# Patient Record
Sex: Male | Born: 1940 | Race: White | Hispanic: No | Marital: Married
Health system: Southern US, Community
[De-identification: ages and names within clinical notes are randomized; demographics above are authoritative.]

## PROBLEM LIST (undated history)

## (undated) DIAGNOSIS — Z8679 Personal history of other diseases of the circulatory system: Secondary | ICD-10-CM

## (undated) DIAGNOSIS — I10 Essential (primary) hypertension: Secondary | ICD-10-CM

## (undated) DIAGNOSIS — K631 Perforation of intestine (nontraumatic): Secondary | ICD-10-CM

## (undated) DIAGNOSIS — G300 Alzheimer's disease with early onset: Secondary | ICD-10-CM

## (undated) DIAGNOSIS — H47011 Ischemic optic neuropathy, right eye: Secondary | ICD-10-CM

## (undated) DIAGNOSIS — E559 Vitamin D deficiency, unspecified: Secondary | ICD-10-CM

## (undated) DIAGNOSIS — E785 Hyperlipidemia, unspecified: Secondary | ICD-10-CM

## (undated) DIAGNOSIS — F0280 Dementia in other diseases classified elsewhere without behavioral disturbance: Secondary | ICD-10-CM

## (undated) HISTORY — PX: TRABECULECTOMY: SHX107

## (undated) HISTORY — PX: CARDIOVERSION: SHX1299

---

## 1989-07-11 DIAGNOSIS — K631 Perforation of intestine (nontraumatic): Secondary | ICD-10-CM

## 1989-07-11 HISTORY — PX: COLON RESECTION: SHX5231

## 1989-07-11 HISTORY — DX: Perforation of intestine (nontraumatic): K63.1

## 1990-07-11 HISTORY — PX: COLOSTOMY REVERSAL: SHX5782

## 2016-05-17 ENCOUNTER — Inpatient Hospital Stay
Admission: RE | Admit: 2016-05-17 | Discharge: 2016-06-24 | Disposition: A | Discharge status: Rehabilitation Facility | Payer: BLUE CROSS/BLUE SHIELD | Attending: Internal Medicine | Admitting: Internal Medicine

## 2016-05-17 ENCOUNTER — Other Ambulatory Visit (HOSPITAL_COMMUNITY): Payer: BLUE CROSS/BLUE SHIELD

## 2016-05-17 DIAGNOSIS — J939 Pneumothorax, unspecified: Secondary | ICD-10-CM

## 2016-05-17 DIAGNOSIS — Z9689 Presence of other specified functional implants: Secondary | ICD-10-CM

## 2016-05-17 DIAGNOSIS — Z4659 Encounter for fitting and adjustment of other gastrointestinal appliance and device: Secondary | ICD-10-CM

## 2016-05-17 DIAGNOSIS — Z931 Gastrostomy status: Secondary | ICD-10-CM

## 2016-05-17 DIAGNOSIS — J90 Pleural effusion, not elsewhere classified: Secondary | ICD-10-CM

## 2016-05-17 DIAGNOSIS — J9690 Respiratory failure, unspecified, unspecified whether with hypoxia or hypercapnia: Secondary | ICD-10-CM

## 2016-05-17 DIAGNOSIS — R0602 Shortness of breath: Secondary | ICD-10-CM

## 2016-05-17 DIAGNOSIS — Z4682 Encounter for fitting and adjustment of non-vascular catheter: Secondary | ICD-10-CM

## 2016-05-17 DIAGNOSIS — J9600 Acute respiratory failure, unspecified whether with hypoxia or hypercapnia: Secondary | ICD-10-CM

## 2016-05-17 HISTORY — DX: Vitamin D deficiency, unspecified: E55.9

## 2016-05-17 HISTORY — DX: Alzheimer's disease with early onset: G30.0

## 2016-05-17 HISTORY — DX: Personal history of other diseases of the circulatory system: Z86.79

## 2016-05-17 HISTORY — DX: Essential (primary) hypertension: I10

## 2016-05-17 HISTORY — DX: Ischemic optic neuropathy, right eye: H47.011

## 2016-05-17 HISTORY — DX: Hyperlipidemia, unspecified: E78.5

## 2016-05-17 HISTORY — DX: Dementia in other diseases classified elsewhere, unspecified severity, without behavioral disturbance, psychotic disturbance, mood disturbance, and anxiety: F02.80

## 2016-05-17 HISTORY — DX: Perforation of intestine (nontraumatic): K63.1

## 2016-05-18 ENCOUNTER — Other Ambulatory Visit (HOSPITAL_COMMUNITY): Payer: BLUE CROSS/BLUE SHIELD

## 2016-05-18 DIAGNOSIS — Z4682 Encounter for fitting and adjustment of non-vascular catheter: Secondary | ICD-10-CM

## 2016-05-18 DIAGNOSIS — Z4659 Encounter for fitting and adjustment of other gastrointestinal appliance and device: Secondary | ICD-10-CM | POA: Diagnosis not present

## 2016-05-18 DIAGNOSIS — J9600 Acute respiratory failure, unspecified whether with hypoxia or hypercapnia: Secondary | ICD-10-CM

## 2016-05-18 LAB — COMPREHENSIVE METABOLIC PANEL
ALBUMIN: 1.6 g/dL — AB (ref 3.5–5.0)
ALT: 36 U/L (ref 17–63)
AST: 45 U/L — AB (ref 15–41)
Alkaline Phosphatase: 115 U/L (ref 38–126)
Anion gap: 8 (ref 5–15)
BUN: 21 mg/dL — AB (ref 6–20)
CHLORIDE: 100 mmol/L — AB (ref 101–111)
CO2: 24 mmol/L (ref 22–32)
Calcium: 8 mg/dL — ABNORMAL LOW (ref 8.9–10.3)
Creatinine, Ser: 0.58 mg/dL — ABNORMAL LOW (ref 0.61–1.24)
GFR calc Af Amer: >60 mL/min (ref >60)
GFR calc non Af Amer: >60 mL/min (ref >60)
GLUCOSE: 144 mg/dL — AB (ref 65–99)
POTASSIUM: 3.8 mmol/L (ref 3.5–5.1)
SODIUM: 132 mmol/L — AB (ref 135–145)
Total Bilirubin: 0.5 mg/dL (ref 0.3–1.2)
Total Protein: 5.6 g/dL — ABNORMAL LOW (ref 6.5–8.1)

## 2016-05-18 LAB — BLOOD GAS, ARTERIAL
ACID-BASE EXCESS: 3.3 mmol/L — AB (ref 0.0–2.0)
Bicarbonate: 26.7 mmol/L (ref 20.0–28.0)
DRAWN BY: 345601
FIO2: 50
MECHVT: 500 mL
O2 Saturation: 99.3 %
PATIENT TEMPERATURE: 98.4
PCO2 ART: 36 mmHg (ref 32.0–48.0)
PEEP/CPAP: 5 cmH2O
PO2 ART: 151 mmHg — AB (ref 83.0–108.0)
RATE: 12 resp/min
pH, Arterial: 7.483 — ABNORMAL HIGH (ref 7.350–7.450)

## 2016-05-18 LAB — CBC
HEMATOCRIT: 31.4 % — AB (ref 39.0–52.0)
HEMOGLOBIN: 9.7 g/dL — AB (ref 13.0–17.0)
MCH: 28 pg (ref 26.0–34.0)
MCHC: 30.9 g/dL (ref 30.0–36.0)
MCV: 90.8 fL (ref 78.0–100.0)
Platelets: 354 10*3/uL (ref 150–400)
RBC: 3.46 MIL/uL — ABNORMAL LOW (ref 4.22–5.81)
RDW: 14.5 % (ref 11.5–15.5)
WBC: 12.1 10*3/uL — ABNORMAL HIGH (ref 4.0–10.5)
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
## 2016-05-19 ENCOUNTER — Other Ambulatory Visit (HOSPITAL_COMMUNITY): Payer: BLUE CROSS/BLUE SHIELD

## 2016-05-19 LAB — TROPONIN I

## 2016-05-20 ENCOUNTER — Other Ambulatory Visit (HOSPITAL_COMMUNITY): Payer: BLUE CROSS/BLUE SHIELD

## 2016-05-20 DIAGNOSIS — J9600 Acute respiratory failure, unspecified whether with hypoxia or hypercapnia: Secondary | ICD-10-CM | POA: Diagnosis not present
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
## 2016-05-22 LAB — BASIC METABOLIC PANEL
ANION GAP: 7 (ref 5–15)
BUN: 15 mg/dL (ref 6–20)
CALCIUM: 7.9 mg/dL — AB (ref 8.9–10.3)
CO2: 27 mmol/L (ref 22–32)
CREATININE: 0.41 mg/dL — AB (ref 0.61–1.24)
Chloride: 100 mmol/L — ABNORMAL LOW (ref 101–111)
Glucose, Bld: 138 mg/dL — ABNORMAL HIGH (ref 65–99)
Potassium: 4.3 mmol/L (ref 3.5–5.1)
SODIUM: 134 mmol/L — AB (ref 135–145)

## 2016-05-22 LAB — MAGNESIUM: MAGNESIUM: 1.5 mg/dL — AB (ref 1.7–2.4)

## 2016-05-22 LAB — CBC
HCT: 30.2 % — ABNORMAL LOW (ref 39.0–52.0)
HEMOGLOBIN: 9.4 g/dL — AB (ref 13.0–17.0)
MCH: 28 pg (ref 26.0–34.0)
MCHC: 31.1 g/dL (ref 30.0–36.0)
MCV: 89.9 fL (ref 78.0–100.0)
PLATELETS: 309 10*3/uL (ref 150–400)
RBC: 3.36 MIL/uL — AB (ref 4.22–5.81)
RDW: 14.5 % (ref 11.5–15.5)
WBC: 9.8 10*3/uL (ref 4.0–10.5)

## 2016-05-23 LAB — MAGNESIUM: MAGNESIUM: 1.7 mg/dL (ref 1.7–2.4)

## 2016-05-24 DIAGNOSIS — J90 Pleural effusion, not elsewhere classified: Secondary | ICD-10-CM

## 2016-05-24 DIAGNOSIS — Z930 Tracheostomy status: Secondary | ICD-10-CM | POA: Diagnosis not present

## 2016-05-24 DIAGNOSIS — J9600 Acute respiratory failure, unspecified whether with hypoxia or hypercapnia: Secondary | ICD-10-CM | POA: Diagnosis not present

## 2016-05-24 DIAGNOSIS — J860 Pyothorax with fistula: Secondary | ICD-10-CM
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
## 2016-05-25 LAB — CULTURE, RESPIRATORY W GRAM STAIN

## 2016-05-25 LAB — CULTURE, RESPIRATORY

## 2016-05-28 LAB — BASIC METABOLIC PANEL
Anion gap: 6 (ref 5–15)
BUN: 21 mg/dL — AB (ref 6–20)
CALCIUM: 8.4 mg/dL — AB (ref 8.9–10.3)
CO2: 31 mmol/L (ref 22–32)
Chloride: 103 mmol/L (ref 101–111)
Creatinine, Ser: 0.3 mg/dL — ABNORMAL LOW (ref 0.61–1.24)
GFR calc Af Amer: >60 mL/min (ref >60)
GLUCOSE: 141 mg/dL — AB (ref 65–99)
Potassium: 3.5 mmol/L (ref 3.5–5.1)
Sodium: 140 mmol/L (ref 135–145)

## 2016-05-28 LAB — CBC
HCT: 31.3 % — ABNORMAL LOW (ref 39.0–52.0)
Hemoglobin: 9.5 g/dL — ABNORMAL LOW (ref 13.0–17.0)
MCH: 27.6 pg (ref 26.0–34.0)
MCHC: 30.4 g/dL (ref 30.0–36.0)
MCV: 91 fL (ref 78.0–100.0)
PLATELETS: 386 10*3/uL (ref 150–400)
RBC: 3.44 MIL/uL — ABNORMAL LOW (ref 4.22–5.81)
RDW: 14.8 % (ref 11.5–15.5)
WBC: 9 10*3/uL (ref 4.0–10.5)

## 2016-05-30 ENCOUNTER — Other Ambulatory Visit (HOSPITAL_COMMUNITY): Payer: BLUE CROSS/BLUE SHIELD

## 2016-05-31 ENCOUNTER — Other Ambulatory Visit (HOSPITAL_COMMUNITY): Payer: BLUE CROSS/BLUE SHIELD

## 2016-05-31 DIAGNOSIS — J9600 Acute respiratory failure, unspecified whether with hypoxia or hypercapnia: Secondary | ICD-10-CM | POA: Diagnosis not present

## 2016-05-31 LAB — C DIFFICILE QUICK SCREEN W PCR REFLEX
C DIFFICLE (CDIFF) ANTIGEN: NEGATIVE
C Diff interpretation: NOT DETECTED
C Diff toxin: NEGATIVE
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
## 2016-06-04 ENCOUNTER — Encounter: Payer: Self-pay | Admitting: General Surgery

## 2016-06-04 LAB — BASIC METABOLIC PANEL
Anion gap: 7 (ref 5–15)
BUN: 15 mg/dL (ref 6–20)
CHLORIDE: 100 mmol/L — AB (ref 101–111)
CO2: 30 mmol/L (ref 22–32)
Calcium: 8.4 mg/dL — ABNORMAL LOW (ref 8.9–10.3)
Creatinine, Ser: 0.37 mg/dL — ABNORMAL LOW (ref 0.61–1.24)
GFR calc Af Amer: >60 mL/min (ref >60)
GFR calc non Af Amer: >60 mL/min (ref >60)
GLUCOSE: 150 mg/dL — AB (ref 65–99)
POTASSIUM: 4.4 mmol/L (ref 3.5–5.1)
Sodium: 137 mmol/L (ref 135–145)

## 2016-06-04 LAB — CBC
HCT: 35.5 % — ABNORMAL LOW (ref 39.0–52.0)
HEMOGLOBIN: 10.7 g/dL — AB (ref 13.0–17.0)
MCH: 26.9 pg (ref 26.0–34.0)
MCHC: 30.1 g/dL (ref 30.0–36.0)
MCV: 89.2 fL (ref 78.0–100.0)
Platelets: 422 10*3/uL — ABNORMAL HIGH (ref 150–400)
RBC: 3.98 MIL/uL — AB (ref 4.22–5.81)
RDW: 15 % (ref 11.5–15.5)
WBC: 14.3 10*3/uL — ABNORMAL HIGH (ref 4.0–10.5)
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
## 2016-06-06 ENCOUNTER — Encounter (HOSPITAL_COMMUNITY): Payer: Self-pay | Admitting: Interventional Radiology

## 2016-06-06 ENCOUNTER — Other Ambulatory Visit (HOSPITAL_COMMUNITY): Payer: BLUE CROSS/BLUE SHIELD

## 2016-06-06 HISTORY — PX: IR GENERIC HISTORICAL: IMG1180011

## 2016-06-06 LAB — CBC
HEMATOCRIT: 37.2 % — AB (ref 39.0–52.0)
HEMOGLOBIN: 11.2 g/dL — AB (ref 13.0–17.0)
MCH: 26.9 pg (ref 26.0–34.0)
MCHC: 30.1 g/dL (ref 30.0–36.0)
MCV: 89.2 fL (ref 78.0–100.0)
Platelets: 439 10*3/uL — ABNORMAL HIGH (ref 150–400)
RBC: 4.17 MIL/uL — ABNORMAL LOW (ref 4.22–5.81)
RDW: 15 % (ref 11.5–15.5)
WBC: 11.1 10*3/uL — AB (ref 4.0–10.5)

## 2016-06-06 LAB — PROTIME-INR
INR: 1.13
Prothrombin Time: 14.5 seconds (ref 11.4–15.2)

## 2016-06-06 MED ORDER — FENTANYL CITRATE (PF) 100 MCG/2ML IJ SOLN
INTRAMUSCULAR | Status: AC | PRN
  Administered 2016-06-06: 25 ug via INTRAVENOUS

## 2016-06-06 MED ORDER — LIDOCAINE HCL 1 % IJ SOLN
INTRAMUSCULAR | Status: AC
  Filled 2016-06-06: qty 20

## 2016-06-06 MED ORDER — LIDOCAINE HCL 1 % IJ SOLN
INTRAMUSCULAR | Status: AC | PRN
  Administered 2016-06-06: 5 mL

## 2016-06-06 MED ORDER — CEFAZOLIN SODIUM-DEXTROSE 2-4 GM/100ML-% IV SOLN
INTRAVENOUS | Status: AC
  Filled 2016-06-06: qty 100

## 2016-06-06 MED ORDER — FENTANYL CITRATE (PF) 100 MCG/2ML IJ SOLN
INTRAMUSCULAR | Status: AC
  Filled 2016-06-06: qty 2

## 2016-06-06 MED ORDER — IOPAMIDOL (ISOVUE-300) INJECTION 61%
INTRAVENOUS | Status: AC
  Administered 2016-06-06: 20 mL
  Filled 2016-06-06: qty 50

## 2016-06-06 MED ORDER — MIDAZOLAM HCL 2 MG/2ML IJ SOLN
INTRAMUSCULAR | Status: AC | PRN
  Administered 2016-06-06: 0.5 mg via INTRAVENOUS

## 2016-06-06 MED ORDER — MIDAZOLAM HCL 2 MG/2ML IJ SOLN
INTRAMUSCULAR | Status: AC
Start: 2016-06-06 — End: 2016-06-07
  Filled 2016-06-06: qty 2

## 2016-06-06 NOTE — Sedation Documentation (Signed)
Suctioned via trach and orally of large amts of secretions

## 2016-06-06 NOTE — Procedures (Signed)
Interventional Radiology Procedure Note  Procedure: Placement of percutaneous 20F pull-through gastrostomy tube. Complications: None Recommendations: - NPO except for sips and chips remainder of today and overnight - Maintain G-tube to LWS until tomorrow morning  - May advance diet as tolerated and begin using tube tomorrow morning  Signed,   Ikeem Cleckler S. Raegen Tarpley, DO   

## 2016-06-07 ENCOUNTER — Other Ambulatory Visit (HOSPITAL_COMMUNITY): Payer: BLUE CROSS/BLUE SHIELD

## 2016-06-07 DIAGNOSIS — J90 Pleural effusion, not elsewhere classified: Secondary | ICD-10-CM

## 2016-06-07 DIAGNOSIS — J939 Pneumothorax, unspecified: Secondary | ICD-10-CM | POA: Diagnosis not present
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
## 2016-06-07 NOTE — Progress Notes (Signed)
Referring Physician(s): Hijazi,A  Supervising Physician: Irish LackYamagata, Glenn  [facility]  Chief Complaint: dysphagia   Subjective:  Pt doing ok today; no acute c/o; wife in room; ready to get NG out  Allergies: Patient has no allergy information on record.  Medications: Prior to Admission medications   Not on File     Vital Signs: BP 122/66   Pulse (!) 111   Resp 16   SpO2 (!) 88%   Physical Exam G tube intact, insertion site clean and dry,NT, no leaking, abd soft  Imaging: Ir Gastrostomy Tube Mod Sed  Result Date: 06/06/2016 INDICATION: 75 year old male with a history of dysphagia EXAM: PERC PLACEMENT GASTROSTOMY MEDICATIONS: 2.0 g Ancef; Antibiotics were administered within 1 hour of the procedure. ANESTHESIA/SEDATION: Versed 0.5 mg IV; Fentanyl 25 mcg IV Moderate Sedation Time:  10 minute The patient was continuously monitored during the procedure by the interventional radiology nurse under my direct supervision. CONTRAST:  10 cc - administered into the gastric lumen. FLUOROSCOPY TIME:  Fluoroscopy Time: 6 minutes 24 seconds (25.0 mGy). COMPLICATIONS: None PROCEDURE: Informed written consent was obtained from the patient's family after a thorough discussion of the procedural risks, benefits and alternatives. All questions were addressed. Maximal Sterile Barrier Technique was utilized including caps, mask, sterile gowns, sterile gloves, sterile drape, hand hygiene and skin antiseptic. A timeout was performed prior to the initiation of the procedure. The procedure, risks, benefits, and alternatives were explained to the patient. Questions regarding the procedure were encouraged and answered. The patient understands and consents to the procedure. The epigastrium was prepped with Betadine in a sterile fashion, and a sterile drape was applied covering the operative field. A sterile gown and sterile gloves were used for the procedure. A 5-French orogastric tube is  placed under fluoroscopic guidance. Scout imaging of the abdomen confirms barium within the transverse colon. The stomach was distended with gas. Under fluoroscopic guidance, an 18 gauge needle was utilized to puncture the anterior wall of the body of the stomach. An Amplatz wire was advanced through the needle passing a T fastener into the lumen of the stomach. The T fastener was secured for gastropexy. A 9-French sheath was inserted. A snare was advanced through the 9-French sheath. A Teena DunkBenson was advanced through the orogastric tube. It was snared then pulled out the oral cavity, pulling the snare, as well. The leading edge of the gastrostomy was attached to the snare. It was then pulled down the esophagus and out the percutaneous site. It was secured in place. Contrast was injected. No complication IMPRESSION: Status post fluoroscopic placed percutaneous gastrostomy tube, with 20 [place]French pull-through. Signed, Yvone NeuJaime S. Loreta AveWagner, DO [facility] Electronically Signed   By: Gilmer MorJaime  Wagner D.O.   On: 06/06/2016 14:38   Dg Chest Port 1 View  Result Date: 06/07/2016 CLINICAL DATA:  Respiratory failure, hemoptysis. Feeding tube placement via the oral route. EXAM: PORTABLE CHEST 1 VIEW COMPARISON:  Portable chest x-ray of May 31, 2016 FINDINGS: The tip of the feeding tube projects below the inferior margin of the image. The lungs remain mildly hyperinflated. The interstitial markings have improved bilaterally. They remain increased at the left lung base in the left hemidiaphragm remains partially obscured. The cardiac silhouette is top-normal in size. The pulmonary vascularity is not engorged. The tracheostomy appliance tip projects between the clavicular heads. The patient has undergone previous posterior fusion of the upper and mid thoracic vertebral levels. IMPRESSION: Fairly stable  appearance of the chest since the previous study. Persistent increased  density in the right lower lung and in the mid and lower left lung worrisome for pneumonia. Electronically Signed   By: David  SwazilandJordan M.D.   On: 06/07/2016 07:45    Labs:  CBC:  Recent Labs  05/22/16 0647 05/28/16 0713 06/04/16 1115 06/06/16 0812  WBC 9.8 9.0 14.3* 11.1*  HGB 9.4* 9.5* 10.7* 11.2*  HCT 30.2* 31.3* 35.5* 37.2*  PLT 309 386 422* 439*    COAGS:  Recent Labs  06/06/16 0812  INR 1.13    BMP:  Recent Labs  05/18/16 0813 05/22/16 0647 05/28/16 0713 06/04/16 1115  NA 132* 134* 140 137  K 3.8 4.3 3.5 4.4  CL 100* 100* 103 100*  CO2 24 27 31 30   GLUCOSE 144* 138* 141* 150*  BUN 21* 15 21* 15  CALCIUM 8.0* 7.9* 8.4* 8.4*  CREATININE 0.58* 0.41* 0.30* 0.37*  GFRNONAA >60 >60 >60 >60  GFRAA >60 >60 >60 >60    LIVER FUNCTION TESTS:  Recent Labs  05/18/16 0813  BILITOT 0.5  AST 45*  ALT 36  ALKPHOS 115  PROT 5.6*  ALBUMIN 1.6*    Assessment and Plan:  S/p perc G tube 11/27; stable; ok to use tube for feeds; other plans as per primary service  Electronically Signed: D. Jeananne RamaKevin Joshus Rogan 06/07/2016, 9:26 AM   I spent a total of  at the the patient's bedside AND on the patient's hospital floor or unit, greater than 50% of which was counseling/coordinating care for gastrostomy tube     Patient ID: Erasmo Leventhalharles Hinderliter, male   DOB: March 16, 1941, 75 y.o.   MRN: [id number]

## 2016-06-08 ENCOUNTER — Other Ambulatory Visit (HOSPITAL_COMMUNITY): Payer: BLUE CROSS/BLUE SHIELD

## 2016-06-10 ENCOUNTER — Other Ambulatory Visit (HOSPITAL_COMMUNITY): Payer: BLUE CROSS/BLUE SHIELD

## 2016-06-10 LAB — BASIC METABOLIC PANEL
ANION GAP: 10 (ref 5–15)
BUN: 20 mg/dL (ref 6–20)
CALCIUM: 8.8 mg/dL — AB (ref 8.9–10.3)
CO2: 31 mmol/L (ref 22–32)
Chloride: 100 mmol/L — ABNORMAL LOW (ref 101–111)
Creatinine, Ser: 0.34 mg/dL — ABNORMAL LOW (ref 0.61–1.24)
Glucose, Bld: 184 mg/dL — ABNORMAL HIGH (ref 65–99)
POTASSIUM: 3.7 mmol/L (ref 3.5–5.1)
SODIUM: 141 mmol/L (ref 135–145)

## 2016-06-10 LAB — CBC
HCT: 35.2 % — ABNORMAL LOW (ref 39.0–52.0)
Hemoglobin: 10.3 g/dL — ABNORMAL LOW (ref 13.0–17.0)
MCH: 26.1 pg (ref 26.0–34.0)
MCHC: 29.3 g/dL — ABNORMAL LOW (ref 30.0–36.0)
MCV: 89.1 fL (ref 78.0–100.0)
PLATELETS: 373 10*3/uL (ref 150–400)
RBC: 3.95 MIL/uL — AB (ref 4.22–5.81)
RDW: 15.2 % (ref 11.5–15.5)
WBC: 8.6 10*3/uL (ref 4.0–10.5)

## 2016-06-14 ENCOUNTER — Other Ambulatory Visit (HOSPITAL_COMMUNITY): Payer: BLUE CROSS/BLUE SHIELD

## 2016-06-14 DIAGNOSIS — Z4682 Encounter for fitting and adjustment of non-vascular catheter: Secondary | ICD-10-CM | POA: Diagnosis not present

## 2016-06-14 DIAGNOSIS — J9600 Acute respiratory failure, unspecified whether with hypoxia or hypercapnia: Secondary | ICD-10-CM | POA: Diagnosis not present

## 2016-06-14 DIAGNOSIS — Z4659 Encounter for fitting and adjustment of other gastrointestinal appliance and device: Secondary | ICD-10-CM | POA: Diagnosis not present

## 2016-06-14 LAB — CBC
HCT: 39.1 % (ref 39.0–52.0)
HEMOGLOBIN: 11.6 g/dL — AB (ref 13.0–17.0)
MCH: 26.7 pg (ref 26.0–34.0)
MCHC: 29.7 g/dL — AB (ref 30.0–36.0)
MCV: 89.9 fL (ref 78.0–100.0)
Platelets: 351 10*3/uL (ref 150–400)
RBC: 4.35 MIL/uL (ref 4.22–5.81)
RDW: 15.6 % — ABNORMAL HIGH (ref 11.5–15.5)
WBC: 19.3 10*3/uL — ABNORMAL HIGH (ref 4.0–10.5)
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
## 2016-06-16 LAB — CBC
HCT: 35.7 % — ABNORMAL LOW (ref 39.0–52.0)
Hemoglobin: 10.6 g/dL — ABNORMAL LOW (ref 13.0–17.0)
MCH: 26.6 pg (ref 26.0–34.0)
MCHC: 29.7 g/dL — ABNORMAL LOW (ref 30.0–36.0)
MCV: 89.7 fL (ref 78.0–100.0)
PLATELETS: 291 10*3/uL (ref 150–400)
RBC: 3.98 MIL/uL — ABNORMAL LOW (ref 4.22–5.81)
RDW: 16.1 % — AB (ref 11.5–15.5)
WBC: 9.1 10*3/uL (ref 4.0–10.5)

## 2016-06-17 ENCOUNTER — Encounter: Payer: Self-pay | Admitting: Physical Medicine and Rehabilitation

## 2016-06-17 LAB — C DIFFICILE QUICK SCREEN W PCR REFLEX
C DIFFICILE (CDIFF) INTERP: NOT DETECTED
C Diff antigen: NEGATIVE
C Diff toxin: NEGATIVE
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
## 2016-06-18 LAB — CULTURE, RESPIRATORY W GRAM STAIN

## 2016-06-18 LAB — CULTURE, RESPIRATORY: CULTURE: NORMAL

## 2016-06-20 ENCOUNTER — Other Ambulatory Visit (HOSPITAL_COMMUNITY): Payer: BLUE CROSS/BLUE SHIELD

## 2016-06-20 LAB — URINALYSIS, ROUTINE W REFLEX MICROSCOPIC
Bilirubin Urine: NEGATIVE
Glucose, UA: NEGATIVE mg/dL
KETONES UR: NEGATIVE mg/dL
Nitrite: NEGATIVE
PROTEIN: 30 mg/dL — AB
Specific Gravity, Urine: 1.01 (ref 1.005–1.030)
pH: 6 (ref 5.0–8.0)

## 2016-06-20 LAB — BLOOD GAS, ARTERIAL
Acid-Base Excess: 6.2 mmol/L — ABNORMAL HIGH (ref 0.0–2.0)
BICARBONATE: 28.6 mmol/L — AB (ref 20.0–28.0)
FIO2: 28
O2 Saturation: 97.7 %
PATIENT TEMPERATURE: 98.6
pCO2 arterial: 30.3 mmHg — ABNORMAL LOW (ref 32.0–48.0)
pH, Arterial: 7.582 — ABNORMAL HIGH (ref 7.350–7.450)
pO2, Arterial: 91.2 mmHg (ref 83.0–108.0)

## 2016-06-20 LAB — URINALYSIS, MICROSCOPIC (REFLEX)

## 2016-06-20 LAB — COMPREHENSIVE METABOLIC PANEL
ALT: 22 U/L (ref 17–63)
ANION GAP: 10 (ref 5–15)
AST: 20 U/L (ref 15–41)
Albumin: 2.3 g/dL — ABNORMAL LOW (ref 3.5–5.0)
Alkaline Phosphatase: 124 U/L (ref 38–126)
BUN: 65 mg/dL — ABNORMAL HIGH (ref 6–20)
CALCIUM: 9.1 mg/dL (ref 8.9–10.3)
CHLORIDE: 106 mmol/L (ref 101–111)
CO2: 35 mmol/L — AB (ref 22–32)
Creatinine, Ser: 0.9 mg/dL (ref 0.61–1.24)
GFR calc non Af Amer: >60 mL/min (ref >60)
Glucose, Bld: 343 mg/dL — ABNORMAL HIGH (ref 65–99)
Potassium: 3.8 mmol/L (ref 3.5–5.1)
SODIUM: 151 mmol/L — AB (ref 135–145)
Total Bilirubin: 0.4 mg/dL (ref 0.3–1.2)
Total Protein: 5.9 g/dL — ABNORMAL LOW (ref 6.5–8.1)

## 2016-06-20 LAB — CULTURE, BLOOD (ROUTINE X 2)
Culture: NO GROWTH
Culture: NO GROWTH

## 2016-06-21 LAB — BASIC METABOLIC PANEL
ANION GAP: 11 (ref 5–15)
BUN: 41 mg/dL — ABNORMAL HIGH (ref 6–20)
CALCIUM: 8.7 mg/dL — AB (ref 8.9–10.3)
CO2: 34 mmol/L — AB (ref 22–32)
Chloride: 106 mmol/L (ref 101–111)
Creatinine, Ser: 0.61 mg/dL (ref 0.61–1.24)
GFR calc non Af Amer: >60 mL/min (ref >60)
Glucose, Bld: 351 mg/dL — ABNORMAL HIGH (ref 65–99)
Potassium: 2.8 mmol/L — ABNORMAL LOW (ref 3.5–5.1)
SODIUM: 151 mmol/L — AB (ref 135–145)

## 2016-06-22 LAB — BASIC METABOLIC PANEL
ANION GAP: 8 (ref 5–15)
BUN: 21 mg/dL — ABNORMAL HIGH (ref 6–20)
CO2: 33 mmol/L — AB (ref 22–32)
Calcium: 8.5 mg/dL — ABNORMAL LOW (ref 8.9–10.3)
Chloride: 105 mmol/L (ref 101–111)
Creatinine, Ser: 0.4 mg/dL — ABNORMAL LOW (ref 0.61–1.24)
GFR calc Af Amer: >60 mL/min (ref >60)
GLUCOSE: 364 mg/dL — AB (ref 65–99)
POTASSIUM: 3.3 mmol/L — AB (ref 3.5–5.1)
Sodium: 146 mmol/L — ABNORMAL HIGH (ref 135–145)

## 2016-06-22 LAB — URINE CULTURE

## 2016-06-23 LAB — BASIC METABOLIC PANEL
ANION GAP: 8 (ref 5–15)
BUN: 17 mg/dL (ref 6–20)
CALCIUM: 8.1 mg/dL — AB (ref 8.9–10.3)
CO2: 32 mmol/L (ref 22–32)
CREATININE: 0.4 mg/dL — AB (ref 0.61–1.24)
Chloride: 101 mmol/L (ref 101–111)
GFR calc Af Amer: >60 mL/min (ref >60)
GLUCOSE: 343 mg/dL — AB (ref 65–99)
Potassium: 3.6 mmol/L (ref 3.5–5.1)
Sodium: 141 mmol/L (ref 135–145)

## 2016-06-23 LAB — CBC
HEMATOCRIT: 35.1 % — AB (ref 39.0–52.0)
Hemoglobin: 10.3 g/dL — ABNORMAL LOW (ref 13.0–17.0)
MCH: 26.1 pg (ref 26.0–34.0)
MCHC: 29.3 g/dL — AB (ref 30.0–36.0)
MCV: 89.1 fL (ref 78.0–100.0)
PLATELETS: 226 10*3/uL (ref 150–400)
RBC: 3.94 MIL/uL — ABNORMAL LOW (ref 4.22–5.81)
RDW: 16.4 % — AB (ref 11.5–15.5)
WBC: 11.4 10*3/uL — AB (ref 4.0–10.5)

## 2016-06-24 ENCOUNTER — Inpatient Hospital Stay (HOSPITAL_COMMUNITY)
Admission: RE | Admit: 2016-06-24 | Discharge: 2016-07-08 | DRG: 949 | Disposition: A | Discharge status: Other Acute Inpatient Hospital | Payer: No Typology Code available for payment source | Source: Intra-hospital | Attending: Physical Medicine & Rehabilitation | Admitting: Physical Medicine & Rehabilitation

## 2016-06-24 DIAGNOSIS — R058 Other specified cough: Secondary | ICD-10-CM

## 2016-06-24 DIAGNOSIS — S2243XD Multiple fractures of ribs, bilateral, subsequent encounter for fracture with routine healing: Secondary | ICD-10-CM

## 2016-06-24 DIAGNOSIS — B370 Candidal stomatitis: Secondary | ICD-10-CM

## 2016-06-24 DIAGNOSIS — J690 Pneumonitis due to inhalation of food and vomit: Secondary | ICD-10-CM | POA: Diagnosis not present

## 2016-06-24 DIAGNOSIS — Z931 Gastrostomy status: Secondary | ICD-10-CM

## 2016-06-24 DIAGNOSIS — E8809 Other disorders of plasma-protein metabolism, not elsewhere classified: Secondary | ICD-10-CM | POA: Diagnosis not present

## 2016-06-24 DIAGNOSIS — E46 Unspecified protein-calorie malnutrition: Secondary | ICD-10-CM

## 2016-06-24 DIAGNOSIS — R531 Weakness: Secondary | ICD-10-CM

## 2016-06-24 DIAGNOSIS — E119 Type 2 diabetes mellitus without complications: Secondary | ICD-10-CM | POA: Diagnosis not present

## 2016-06-24 DIAGNOSIS — N400 Enlarged prostate without lower urinary tract symptoms: Secondary | ICD-10-CM

## 2016-06-24 DIAGNOSIS — R05 Cough: Secondary | ICD-10-CM

## 2016-06-24 DIAGNOSIS — S271XXD Traumatic hemothorax, subsequent encounter: Secondary | ICD-10-CM | POA: Diagnosis not present

## 2016-06-24 DIAGNOSIS — B965 Pseudomonas (aeruginosa) (mallei) (pseudomallei) as the cause of diseases classified elsewhere: Secondary | ICD-10-CM

## 2016-06-24 DIAGNOSIS — I482 Chronic atrial fibrillation, unspecified: Secondary | ICD-10-CM

## 2016-06-24 DIAGNOSIS — R0602 Shortness of breath: Secondary | ICD-10-CM

## 2016-06-24 DIAGNOSIS — J90 Pleural effusion, not elsewhere classified: Secondary | ICD-10-CM

## 2016-06-24 DIAGNOSIS — E1165 Type 2 diabetes mellitus with hyperglycemia: Secondary | ICD-10-CM

## 2016-06-24 DIAGNOSIS — A499 Bacterial infection, unspecified: Secondary | ICD-10-CM

## 2016-06-24 DIAGNOSIS — S22059D Unspecified fracture of T5-T6 vertebra, subsequent encounter for fracture with routine healing: Secondary | ICD-10-CM

## 2016-06-24 DIAGNOSIS — R1310 Dysphagia, unspecified: Secondary | ICD-10-CM | POA: Diagnosis not present

## 2016-06-24 DIAGNOSIS — G4733 Obstructive sleep apnea (adult) (pediatric): Secondary | ICD-10-CM | POA: Diagnosis not present

## 2016-06-24 DIAGNOSIS — N319 Neuromuscular dysfunction of bladder, unspecified: Secondary | ICD-10-CM | POA: Diagnosis not present

## 2016-06-24 DIAGNOSIS — K592 Neurogenic bowel, not elsewhere classified: Secondary | ICD-10-CM | POA: Diagnosis not present

## 2016-06-24 DIAGNOSIS — F0280 Dementia in other diseases classified elsewhere without behavioral disturbance: Secondary | ICD-10-CM | POA: Diagnosis not present

## 2016-06-24 DIAGNOSIS — I480 Paroxysmal atrial fibrillation: Secondary | ICD-10-CM

## 2016-06-24 DIAGNOSIS — G300 Alzheimer's disease with early onset: Secondary | ICD-10-CM | POA: Diagnosis not present

## 2016-06-24 DIAGNOSIS — S24102S Unspecified injury at T2-T6 level of thoracic spinal cord, sequela: Secondary | ICD-10-CM | POA: Diagnosis not present

## 2016-06-24 DIAGNOSIS — R0600 Dyspnea, unspecified: Secondary | ICD-10-CM

## 2016-06-24 DIAGNOSIS — E785 Hyperlipidemia, unspecified: Secondary | ICD-10-CM

## 2016-06-24 DIAGNOSIS — L89152 Pressure ulcer of sacral region, stage 2: Secondary | ICD-10-CM

## 2016-06-24 DIAGNOSIS — N390 Urinary tract infection, site not specified: Secondary | ICD-10-CM | POA: Diagnosis not present

## 2016-06-24 DIAGNOSIS — S24102D Unspecified injury at T2-T6 level of thoracic spinal cord, subsequent encounter: Secondary | ICD-10-CM | POA: Diagnosis present

## 2016-06-24 DIAGNOSIS — J189 Pneumonia, unspecified organism: Secondary | ICD-10-CM

## 2016-06-24 DIAGNOSIS — I159 Secondary hypertension, unspecified: Secondary | ICD-10-CM

## 2016-06-24 DIAGNOSIS — Z981 Arthrodesis status: Secondary | ICD-10-CM

## 2016-06-24 DIAGNOSIS — J9621 Acute and chronic respiratory failure with hypoxia: Secondary | ICD-10-CM | POA: Diagnosis not present

## 2016-06-24 DIAGNOSIS — F419 Anxiety disorder, unspecified: Secondary | ICD-10-CM | POA: Diagnosis not present

## 2016-06-24 DIAGNOSIS — D62 Acute posthemorrhagic anemia: Secondary | ICD-10-CM

## 2016-06-24 DIAGNOSIS — F329 Major depressive disorder, single episode, unspecified: Secondary | ICD-10-CM

## 2016-06-24 DIAGNOSIS — R0989 Other specified symptoms and signs involving the circulatory and respiratory systems: Secondary | ICD-10-CM

## 2016-06-24 DIAGNOSIS — G839 Paralytic syndrome, unspecified: Secondary | ICD-10-CM | POA: Diagnosis not present

## 2016-06-24 DIAGNOSIS — Z8673 Personal history of transient ischemic attack (TIA), and cerebral infarction without residual deficits: Secondary | ICD-10-CM | POA: Diagnosis not present

## 2016-06-24 DIAGNOSIS — Z930 Tracheostomy status: Secondary | ICD-10-CM

## 2016-06-24 DIAGNOSIS — I158 Other secondary hypertension: Secondary | ICD-10-CM

## 2016-06-24 DIAGNOSIS — A0471 Enterocolitis due to Clostridium difficile, recurrent: Secondary | ICD-10-CM | POA: Diagnosis not present

## 2016-06-24 DIAGNOSIS — D72829 Elevated white blood cell count, unspecified: Secondary | ICD-10-CM | POA: Diagnosis not present

## 2016-06-24 DIAGNOSIS — A0472 Enterocolitis due to Clostridium difficile, not specified as recurrent: Secondary | ICD-10-CM | POA: Diagnosis not present

## 2016-06-24 DIAGNOSIS — S24102A Unspecified injury at T2-T6 level of thoracic spinal cord, initial encounter: Secondary | ICD-10-CM

## 2016-06-24 DIAGNOSIS — G8220 Paraplegia, unspecified: Secondary | ICD-10-CM | POA: Diagnosis present

## 2016-06-24 LAB — GLUCOSE, CAPILLARY
GLUCOSE-CAPILLARY: 141 mg/dL — AB (ref 65–99)
GLUCOSE-CAPILLARY: 177 mg/dL — AB (ref 65–99)
Glucose-Capillary: 151 mg/dL — ABNORMAL HIGH (ref 65–99)

## 2016-06-24 MED ORDER — SODIUM CHLORIDE 3 % IN NEBU
4.0000 mL | INHALATION_SOLUTION | Freq: Two times a day (BID) | RESPIRATORY_TRACT | Status: DC
  Administered 2016-06-24 – 2016-06-26 (×3): 4 mL via RESPIRATORY_TRACT
  Filled 2016-06-24 (×4): qty 4

## 2016-06-24 MED ORDER — VITAL 1.5 CAL PO LIQD
1000.0000 mL | ORAL | Status: DC
Start: 2016-06-24 — End: 2016-06-25
  Administered 2016-06-24: 1000 mL
  Filled 2016-06-24 (×4): qty 1000

## 2016-06-24 MED ORDER — SODIUM CHLORIDE 0.9 % IV SOLN
1.0000 g | Freq: Three times a day (TID) | INTRAVENOUS | Status: DC
  Filled 2016-06-24 (×3): qty 1

## 2016-06-24 MED ORDER — PROCHLORPERAZINE 25 MG RE SUPP: 12.5000 mg | Freq: Four times a day (QID) | RECTAL | Status: DC | PRN

## 2016-06-24 MED ORDER — TRAZODONE HCL 50 MG PO TABS
25.0000 mg | ORAL_TABLET | Freq: Every evening | ORAL | Status: DC | PRN
  Administered 2016-06-25 – 2016-07-07 (×13): 50 mg
  Filled 2016-06-24 (×12): qty 1

## 2016-06-24 MED ORDER — FREE WATER
200.0000 mL | Freq: Three times a day (TID) | Status: DC
  Administered 2016-06-24 – 2016-07-08 (×56): 200 mL

## 2016-06-24 MED ORDER — CLONAZEPAM 0.125 MG PO TBDP
0.2500 mg | ORAL_TABLET | Freq: Two times a day (BID) | ORAL | Status: DC | PRN
  Administered 2016-06-26 – 2016-07-07 (×5): 0.25 mg
  Filled 2016-06-24 (×6): qty 2

## 2016-06-24 MED ORDER — CHOLESTYRAMINE 4 G PO PACK: 4.0000 g | PACK | Freq: Two times a day (BID) | ORAL | Status: DC

## 2016-06-24 MED ORDER — HYDROCERIN EX CREA
TOPICAL_CREAM | Freq: Two times a day (BID) | CUTANEOUS | Status: DC
  Administered 2016-06-24: 1 via TOPICAL
  Administered 2016-06-25 – 2016-07-08 (×26): via TOPICAL
  Filled 2016-06-24: qty 113

## 2016-06-24 MED ORDER — FAMOTIDINE 40 MG/5ML PO SUSR
20.0000 mg | Freq: Two times a day (BID) | ORAL | Status: DC
  Administered 2016-06-24 – 2016-07-08 (×27): 20 mg
  Filled 2016-06-24 (×29): qty 2.5

## 2016-06-24 MED ORDER — VITAMIN C 500 MG PO TABS
500.0000 mg | ORAL_TABLET | Freq: Two times a day (BID) | ORAL | Status: DC
  Administered 2016-06-24 – 2016-07-08 (×28): 500 mg
  Filled 2016-06-24 (×30): qty 1

## 2016-06-24 MED ORDER — PRO-STAT SUGAR FREE PO LIQD
30.0000 mL | Freq: Three times a day (TID) | ORAL | Status: DC
  Administered 2016-06-24 – 2016-06-25 (×2): 30 mL
  Filled 2016-06-24 (×2): qty 30

## 2016-06-24 MED ORDER — ADULT MULTIVITAMIN LIQUID CH
15.0000 mL | Freq: Every day | ORAL | Status: DC
  Administered 2016-06-25 – 2016-07-08 (×15): 15 mL
  Filled 2016-06-24 (×14): qty 15

## 2016-06-24 MED ORDER — FLECAINIDE ACETATE 100 MG PO TABS
100.0000 mg | ORAL_TABLET | Freq: Two times a day (BID) | ORAL | Status: DC
  Administered 2016-06-24 – 2016-07-08 (×27): 100 mg
  Filled 2016-06-24 (×29): qty 1

## 2016-06-24 MED ORDER — ZINC SULFATE 220 (50 ZN) MG PO CAPS
220.0000 mg | ORAL_CAPSULE | Freq: Two times a day (BID) | ORAL | Status: DC
  Administered 2016-06-24 – 2016-07-08 (×27): 220 mg
  Filled 2016-06-24 (×30): qty 1

## 2016-06-24 MED ORDER — IPRATROPIUM-ALBUTEROL 0.5-2.5 (3) MG/3ML IN SOLN
3.0000 mL | RESPIRATORY_TRACT | Status: DC | PRN
  Administered 2016-06-24: 3 mL via RESPIRATORY_TRACT
  Filled 2016-06-24 (×3): qty 3

## 2016-06-24 MED ORDER — PROCHLORPERAZINE EDISYLATE 5 MG/ML IJ SOLN: 5.0000 mg | Freq: Four times a day (QID) | INTRAMUSCULAR | Status: DC | PRN

## 2016-06-24 MED ORDER — POLYETHYLENE GLYCOL 3350 17 G PO PACK
17.0000 g | PACK | Freq: Every day | ORAL | Status: DC | PRN
  Administered 2016-07-02: 17 g
  Filled 2016-06-24: qty 1

## 2016-06-24 MED ORDER — CHLORHEXIDINE GLUCONATE 0.12 % MT SOLN
15.0000 mL | Freq: Two times a day (BID) | OROMUCOSAL | Status: DC
  Administered 2016-06-24 – 2016-06-27 (×6): 15 mL via OROMUCOSAL
  Filled 2016-06-24 (×6): qty 15

## 2016-06-24 MED ORDER — MEMANTINE HCL 10 MG PO TABS
10.0000 mg | ORAL_TABLET | Freq: Two times a day (BID) | ORAL | Status: DC
  Administered 2016-06-24 – 2016-07-08 (×28): 10 mg
  Filled 2016-06-24 (×30): qty 1

## 2016-06-24 MED ORDER — PROCHLORPERAZINE MALEATE 5 MG PO TABS: 5.0000 mg | ORAL_TABLET | Freq: Four times a day (QID) | ORAL | Status: DC | PRN

## 2016-06-24 MED ORDER — ALUM & MAG HYDROXIDE-SIMETH 200-200-20 MG/5ML PO SUSP: 30.0000 mL | ORAL | Status: DC | PRN

## 2016-06-24 MED ORDER — INSULIN GLARGINE 100 UNIT/ML ~~LOC~~ SOLN
10.0000 [IU] | Freq: Two times a day (BID) | SUBCUTANEOUS | Status: DC
  Administered 2016-06-25 – 2016-06-28 (×7): 10 [IU] via SUBCUTANEOUS
  Filled 2016-06-24 (×7): qty 0.1

## 2016-06-24 MED ORDER — METOPROLOL TARTRATE 25 MG/10 ML ORAL SUSPENSION
25.0000 mg | Freq: Two times a day (BID) | ORAL | Status: DC
Start: 2016-06-24 — End: 2016-07-08
  Administered 2016-06-24 – 2016-07-08 (×27): 25 mg
  Filled 2016-06-24 (×28): qty 10

## 2016-06-24 MED ORDER — ACETAMINOPHEN 325 MG PO TABS
325.0000 mg | ORAL_TABLET | ORAL | Status: DC | PRN
  Administered 2016-07-08 (×2): 650 mg
  Filled 2016-06-24 (×2): qty 2

## 2016-06-24 MED ORDER — OXYCODONE HCL 5 MG/5ML PO SOLN
5.0000 mg | ORAL | Status: DC | PRN
  Administered 2016-06-24 – 2016-06-30 (×4): 5 mg
  Filled 2016-06-24 (×4): qty 5

## 2016-06-24 MED ORDER — DIPHENHYDRAMINE HCL 12.5 MG/5ML PO ELIX: 12.5000 mg | ORAL_SOLUTION | Freq: Four times a day (QID) | ORAL | Status: DC | PRN

## 2016-06-24 MED ORDER — PYRIDOXINE HCL 25 MG PO TABS
25.0000 mg | ORAL_TABLET | Freq: Every day | ORAL | Status: DC
  Administered 2016-06-25 – 2016-07-08 (×14): 25 mg
  Filled 2016-06-24 (×14): qty 1

## 2016-06-24 MED ORDER — GUAIFENESIN-DM 100-10 MG/5ML PO SYRP
5.0000 mL | ORAL_SOLUTION | Freq: Four times a day (QID) | ORAL | Status: DC | PRN
  Administered 2016-06-30 – 2016-07-01 (×2): 10 mL via ORAL
  Filled 2016-06-24 (×2): qty 10

## 2016-06-24 MED ORDER — INSULIN ASPART 100 UNIT/ML ~~LOC~~ SOLN
0.0000 [IU] | SUBCUTANEOUS | Status: DC
  Administered 2016-06-24 – 2016-06-25 (×2): 2 [IU] via SUBCUTANEOUS
  Administered 2016-06-25 (×5): 3 [IU] via SUBCUTANEOUS
  Administered 2016-06-25 – 2016-06-26 (×2): 2 [IU] via SUBCUTANEOUS
  Administered 2016-06-26: 3 [IU] via SUBCUTANEOUS
  Administered 2016-06-26: 2 [IU] via SUBCUTANEOUS
  Administered 2016-06-26: 3 [IU] via SUBCUTANEOUS
  Administered 2016-06-26 – 2016-06-27 (×3): 2 [IU] via SUBCUTANEOUS
  Administered 2016-06-27 (×3): 3 [IU] via SUBCUTANEOUS
  Administered 2016-06-27 – 2016-06-28 (×2): 2 [IU] via SUBCUTANEOUS
  Administered 2016-06-28: 1 [IU] via SUBCUTANEOUS
  Administered 2016-06-28: 3 [IU] via SUBCUTANEOUS
  Administered 2016-06-28: 2 [IU] via SUBCUTANEOUS
  Administered 2016-06-28: 1 [IU] via SUBCUTANEOUS
  Administered 2016-06-29 (×5): 2 [IU] via SUBCUTANEOUS
  Administered 2016-06-30: 1 [IU] via SUBCUTANEOUS
  Administered 2016-06-30 – 2016-07-01 (×8): 2 [IU] via SUBCUTANEOUS
  Administered 2016-07-01: 1 [IU] via SUBCUTANEOUS
  Administered 2016-07-02 (×6): 2 [IU] via SUBCUTANEOUS
  Administered 2016-07-03 (×4): 1 [IU] via SUBCUTANEOUS
  Administered 2016-07-03 (×2): 2 [IU] via SUBCUTANEOUS
  Administered 2016-07-04 (×2): 3 [IU] via SUBCUTANEOUS
  Administered 2016-07-04 (×3): 2 [IU] via SUBCUTANEOUS
  Administered 2016-07-04: 1 [IU] via SUBCUTANEOUS
  Administered 2016-07-05: 3 [IU] via SUBCUTANEOUS
  Administered 2016-07-05: 2 [IU] via SUBCUTANEOUS
  Administered 2016-07-05 (×2): 3 [IU] via SUBCUTANEOUS
  Administered 2016-07-05 (×2): 2 [IU] via SUBCUTANEOUS
  Administered 2016-07-06 (×2): 1 [IU] via SUBCUTANEOUS
  Administered 2016-07-06: 3 [IU] via SUBCUTANEOUS
  Administered 2016-07-06 – 2016-07-08 (×6): 2 [IU] via SUBCUTANEOUS
  Administered 2016-07-08: 5 [IU] via SUBCUTANEOUS
  Administered 2016-07-08: 2 [IU] via SUBCUTANEOUS

## 2016-06-24 MED ORDER — BISACODYL 10 MG RE SUPP: 10.0000 mg | Freq: Every day | RECTAL | Status: DC | PRN

## 2016-06-24 MED ORDER — CHOLESTYRAMINE 4 G PO PACK
4.0000 g | PACK | Freq: Two times a day (BID) | ORAL | Status: DC
  Administered 2016-06-24 – 2016-06-30 (×12): 4 g
  Filled 2016-06-24 (×12): qty 1

## 2016-06-24 MED ORDER — ENOXAPARIN SODIUM 40 MG/0.4ML ~~LOC~~ SOLN
40.0000 mg | SUBCUTANEOUS | Status: DC
  Administered 2016-06-25 – 2016-06-29 (×5): 40 mg via SUBCUTANEOUS
  Filled 2016-06-24 (×7): qty 0.4

## 2016-06-24 MED ORDER — ATORVASTATIN CALCIUM 10 MG PO TABS
10.0000 mg | ORAL_TABLET | Freq: Every day | ORAL | Status: DC
  Administered 2016-06-24 – 2016-07-07 (×14): 10 mg
  Filled 2016-06-24 (×15): qty 1

## 2016-06-24 MED ORDER — SODIUM CHLORIDE 0.9 % IV SOLN
1.0000 g | Freq: Three times a day (TID) | INTRAVENOUS | Status: DC
  Administered 2016-06-24 – 2016-06-30 (×16): 1 g via INTRAVENOUS
  Filled 2016-06-24 (×19): qty 1

## 2016-06-24 MED ORDER — ORAL CARE MOUTH RINSE
15.0000 mL | Freq: Two times a day (BID) | OROMUCOSAL | Status: DC
  Administered 2016-06-24 – 2016-07-06 (×22): 15 mL via OROMUCOSAL

## 2016-06-24 MED ORDER — FLEET ENEMA 7-19 GM/118ML RE ENEM: 1.0000 | ENEMA | Freq: Once | RECTAL | Status: DC | PRN
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
## 2016-06-24 NOTE — Progress Notes (Signed)
Orthopedic Tech Progress Note Patient Details:  Samuel LeventhalCharles Peck 1940/08/22 [id number]  Ortho Devices Type of Ortho Device: Postop shoe/boot Ortho Device/Splint Location: Applied Prafo post op boot/shoe to left foot as advised by nurse.  Ortho Device/Splint Interventions: Application, Adjustment   Alvina ChouWilliams, Jomari Bartnik C 06/24/2016, 4:35 PM
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
## 2016-06-24 NOTE — Progress Notes (Signed)
Patient arrived from [facility] at 1506, assigned to Room [phone]. Patient placed on Enteric Precautions

## 2016-06-25 ENCOUNTER — Inpatient Hospital Stay (HOSPITAL_COMMUNITY): Payer: Medicare Other | Admitting: Occupational Therapy

## 2016-06-25 ENCOUNTER — Inpatient Hospital Stay (HOSPITAL_COMMUNITY): Payer: Medicare Other | Admitting: Speech Pathology

## 2016-06-25 ENCOUNTER — Inpatient Hospital Stay (HOSPITAL_COMMUNITY): Payer: Medicare Other | Admitting: Physical Therapy

## 2016-06-25 ENCOUNTER — Inpatient Hospital Stay (HOSPITAL_COMMUNITY): Payer: No Typology Code available for payment source

## 2016-06-25 LAB — COMPREHENSIVE METABOLIC PANEL
ALBUMIN: 1.6 g/dL — AB (ref 3.5–5.0)
ALT: 82 U/L — ABNORMAL HIGH (ref 17–63)
ANION GAP: 12 (ref 5–15)
AST: 61 U/L — ABNORMAL HIGH (ref 15–41)
Alkaline Phosphatase: 135 U/L — ABNORMAL HIGH (ref 38–126)
BUN: 17 mg/dL (ref 6–20)
CHLORIDE: 100 mmol/L — AB (ref 101–111)
CO2: 26 mmol/L (ref 22–32)
Calcium: 8.4 mg/dL — ABNORMAL LOW (ref 8.9–10.3)
Creatinine, Ser: 0.38 mg/dL — ABNORMAL LOW (ref 0.61–1.24)
GFR calc Af Amer: >60 mL/min (ref >60)
GFR calc non Af Amer: >60 mL/min (ref >60)
GLUCOSE: 182 mg/dL — AB (ref 65–99)
POTASSIUM: 3.4 mmol/L — AB (ref 3.5–5.1)
SODIUM: 138 mmol/L (ref 135–145)
TOTAL PROTEIN: 5 g/dL — AB (ref 6.5–8.1)
Total Bilirubin: 0.4 mg/dL (ref 0.3–1.2)

## 2016-06-25 LAB — IRON AND TIBC
IRON: 17 ug/dL — AB (ref 45–182)
SATURATION RATIOS: 12 % — AB (ref 17.9–39.5)
TIBC: 147 ug/dL — AB (ref 250–450)
UIBC: 130 ug/dL

## 2016-06-25 LAB — CBC WITH DIFFERENTIAL/PLATELET
BASOS ABS: 0 10*3/uL (ref 0.0–0.1)
Basophils Relative: 0 %
Eosinophils Absolute: 0.3 10*3/uL (ref 0.0–0.7)
Eosinophils Relative: 3 %
HEMATOCRIT: 33.8 % — AB (ref 39.0–52.0)
Hemoglobin: 10.2 g/dL — ABNORMAL LOW (ref 13.0–17.0)
LYMPHS PCT: 9 %
Lymphs Abs: 0.9 10*3/uL (ref 0.7–4.0)
MCH: 26.2 pg (ref 26.0–34.0)
MCHC: 30.2 g/dL (ref 30.0–36.0)
MCV: 86.7 fL (ref 78.0–100.0)
MONO ABS: 0.4 10*3/uL (ref 0.1–1.0)
MONOS PCT: 4 %
NEUTROS ABS: 8.4 10*3/uL — AB (ref 1.7–7.7)
Neutrophils Relative %: 84 %
Platelets: 262 10*3/uL (ref 150–400)
RBC: 3.9 MIL/uL — ABNORMAL LOW (ref 4.22–5.81)
RDW: 16.7 % — AB (ref 11.5–15.5)
WBC: 10.1 10*3/uL (ref 4.0–10.5)

## 2016-06-25 LAB — VITAMIN B12: Vitamin B-12: 1335 pg/mL — ABNORMAL HIGH (ref 180–914)

## 2016-06-25 LAB — GLUCOSE, CAPILLARY
GLUCOSE-CAPILLARY: 202 mg/dL — AB (ref 65–99)
GLUCOSE-CAPILLARY: 222 mg/dL — AB (ref 65–99)
GLUCOSE-CAPILLARY: 153 mg/dL — AB (ref 65–99)
GLUCOSE-CAPILLARY: 213 mg/dL — AB (ref 65–99)
GLUCOSE-CAPILLARY: 164 mg/dL — AB (ref 65–99)
GLUCOSE-CAPILLARY: 207 mg/dL — AB (ref 65–99)
Glucose-Capillary: 214 mg/dL — ABNORMAL HIGH (ref 65–99)
Glucose-Capillary: 212 mg/dL — ABNORMAL HIGH (ref 65–99)

## 2016-06-25 LAB — RETICULOCYTES
RBC.: 3.9 MIL/uL — ABNORMAL LOW (ref 4.22–5.81)
RETIC COUNT ABSOLUTE: 97.5 10*3/uL (ref 19.0–186.0)
Retic Ct Pct: 2.5 % (ref 0.4–3.1)

## 2016-06-25 LAB — FERRITIN: FERRITIN: 822 ng/mL — AB (ref 24–336)

## 2016-06-25 LAB — FOLATE: Folate: 18.8 ng/mL (ref >5.9)

## 2016-06-25 MED ORDER — PIVOT 1.5 CAL PO LIQD
1000.0000 mL | ORAL | Status: DC
  Administered 2016-06-25 – 2016-06-26 (×2): 1000 mL
  Filled 2016-06-25 (×8): qty 1000

## 2016-06-25 MED ORDER — POTASSIUM CHLORIDE 20 MEQ/15ML (10%) PO SOLN
20.0000 meq | Freq: Two times a day (BID) | ORAL | Status: AC
  Administered 2016-06-25 (×2): 20 meq
  Filled 2016-06-25 (×2): qty 15

## 2016-06-25 NOTE — Progress Notes (Signed)
Samuel Peck is a 75 y.o. male December 15, 1940 [id number]  Subjective: No new complaints. Chart reviewed  Objective: Vital signs in last 24 hours: Temp:  [97.8 F (36.6 C)-98.2 F (36.8 C)] 97.8 F (36.6 C) (12/16 0515) Pulse Rate:  [70-88] 84 (12/16 0921) Resp:  [16-24] 22 (12/16 0900) BP: (99-127)/(38-64) 99/47 (12/16 0921) SpO2:  [91 %-98 %] 93 % (12/16 0900) FiO2 (%):  [28 %] 28 % (12/16 0900) Weight:  [88 kg (194 lb)] 88 kg (194 lb) (12/15 1542) Weight change:  Last BM Date: 06/24/16  Intake/Output from previous day: 12/15 0701 - 12/16 0700 In: -  Out: 1725 [Urine:1725]  Physical Exam General: No apparent distress   Chronically ill Lungs: Normal effort. Lungs clear to auscultation anteriorly  Cardiovascular: irregular rate and rhythm Wounds: Sacral wound not examined by me  Lab Results: BMET    Component Value Date/Time   NA 138 06/25/2016 0428   K 3.4 (L) 06/25/2016 0428   CL 100 (L) 06/25/2016 0428   CO2 26 06/25/2016 0428   GLUCOSE 182 (H) 06/25/2016 0428   BUN 17 06/25/2016 0428   CREATININE 0.38 (L) 06/25/2016 0428   CALCIUM 8.4 (L) 06/25/2016 0428   GFRNONAA >60 06/25/2016 0428   GFRAA >60 06/25/2016 0428   CBC    Component Value Date/Time   WBC 10.1 06/25/2016 0428   RBC 3.90 (L) 06/25/2016 0428   RBC 3.90 (L) 06/25/2016 0428   HGB 10.2 (L) 06/25/2016 0428   HCT 33.8 (L) 06/25/2016 0428   PLT 262 06/25/2016 0428   MCV 86.7 06/25/2016 0428   MCH 26.2 06/25/2016 0428   MCHC 30.2 06/25/2016 0428   RDW 16.7 (H) 06/25/2016 0428   LYMPHSABS 0.9 06/25/2016 0428   MONOABS 0.4 06/25/2016 0428   EOSABS 0.3 06/25/2016 0428   BASOSABS 0.0 06/25/2016 0428   CBG's (last 3):   Recent Labs  06/25/16 0522 06/25/16 0650 06/25/16 0839  GLUCAP 212* 202* 222*   No results found for: HGBA1C  LFT's Lab Results  Component Value Date   ALT 82 (H) 06/25/2016   AST 61 (H) 06/25/2016   ALKPHOS 135 (H) 06/25/2016   BILITOT 0.4 06/25/2016     Studies/Results: No results found.  Medications:  I have reviewed the patient's current medications. Scheduled Medications: . atorvastatin  10 mg Per Tube q1800  . chlorhexidine  15 mL Mouth Rinse BID  . cholestyramine  4 g Per Tube BID  . enoxaparin (LOVENOX) injection  40 mg Subcutaneous Q24H  . famotidine  20 mg Per Tube BID  . flecainide  100 mg Per Tube Q12H  . free water  200 mL Per Tube TID WC & HS  . hydrocerin   Topical BID  . insulin aspart  0-9 Units Subcutaneous Q4H  . insulin glargine  10 Units Subcutaneous BID  . mouth rinse  15 mL Mouth Rinse q12n4p  . memantine  10 mg Per Tube BID  . meropenem (MERREM) IV  1 g Intravenous Q8H  . metoprolol tartrate  25 mg Per Tube BID  . multivitamin  15 mL Per Tube Daily  . potassium chloride  20 mEq Per Tube BID  . vitamin B-6  25 mg Per Tube Daily  . sodium chloride HYPERTONIC  4 mL Nebulization BID  . vitamin C  500 mg Per Tube BID  . 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 Functional and mobility deficitssecondary to T5 fracture/SCI with paraplegia. Multiple post- injury complications  -Continue CIR and ongoing care 2. DVT Prophylaxis/Anticoagulation: Pharmaceutical: Lovenox/ consider resuming eliquis? Discuss with surgery next week 3. Pain Management: tylenol, tramadol for more severe pain 4. Mood: team to provide ego support 5. Neuropsych: This patient is capable of making decisions on  hisown behalf. 6. Skin/Wound Care: Air mattress overlay. ordered additonal prevealon boot for use when in bed. Eucerin cream to bilateral feet. Maintain adequate nutritional and hydration status. 7. Fluids/Electrolytes/Nutrition: Strict NPO at this time. Consult dietician to assist with tube feeds. Will change to more elemental form to see if this helps with ongoing diarrhea. D/C IVF and increase water flushes. Replace K+ 8. Sacral decub Stage II: Has has a lot of muscle loss--added protein supplement to tube feeds. Add additional vitamins to help promote healing. Pending consult of WOC for input--? Accyzume with wet to dry dressing to sacrum.  9. Pseudomonas Aeruginosa UTI with Urosepsis: Has been afebrile on antibiotics. To continue meropenum for 7 days per IM. Will change out foley as has been in 30 days.  10. Acute renal failure: Resolved. Check BMet repeat in 48 hours to monitor for stability.  11. VDRF with tracheostomy: Case discussed with PCCM NP prior to transfer to CIR--to continue CFS # 6 cough/endurance improves. Ordered chest vest as well as hypertonic saline nebs X 48 hours to help with mobilize mucous/atelectasis.  12. H/o A fib s/p DCCV: Off Eliquis due to trauma. Monitor HR qid.  13. Recent C diff colitis: treated. Monitor for recurrence with antibiotics on board again for UTI 14. Dysphagia: Continue NPO status. Speech therapy for PMSV and trials of ice chips as indicated. 15. Hyperglycemia: Likely due to illness/tube feeds--check Hgb A1c- monitor BS every 4 hours for now.  16. Protein calorie Malnutrition: Will increase rate of TF for now till evaluated by RD.   Length of stay, days: 1   Samuel A. Felicity CoyerLeschber, MD 06/25/2016, 11:21 AM

## 2016-06-25 NOTE — Progress Notes (Signed)
Pt is sleeping comfortably no distress or complications noted.
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
## 2016-06-25 NOTE — Progress Notes (Signed)
CPT chest vest done, pt tolerated it well no distress or complications noted.

## 2016-06-26 ENCOUNTER — Inpatient Hospital Stay (HOSPITAL_COMMUNITY): Payer: No Typology Code available for payment source

## 2016-06-26 ENCOUNTER — Inpatient Hospital Stay (HOSPITAL_COMMUNITY): Payer: No Typology Code available for payment source | Admitting: Physical Therapy

## 2016-06-26 DIAGNOSIS — S24102S Unspecified injury at T2-T6 level of thoracic spinal cord, sequela: Secondary | ICD-10-CM

## 2016-06-26 DIAGNOSIS — L89152 Pressure ulcer of sacral region, stage 2: Secondary | ICD-10-CM

## 2016-06-26 DIAGNOSIS — G839 Paralytic syndrome, unspecified: Secondary | ICD-10-CM

## 2016-06-26 DIAGNOSIS — R0602 Shortness of breath: Secondary | ICD-10-CM

## 2016-06-26 LAB — GLUCOSE, CAPILLARY
GLUCOSE-CAPILLARY: 185 mg/dL — AB (ref 65–99)
GLUCOSE-CAPILLARY: 193 mg/dL — AB (ref 65–99)
GLUCOSE-CAPILLARY: 200 mg/dL — AB (ref 65–99)
GLUCOSE-CAPILLARY: 230 mg/dL — AB (ref 65–99)
GLUCOSE-CAPILLARY: 236 mg/dL — AB (ref 65–99)
Glucose-Capillary: 171 mg/dL — ABNORMAL HIGH (ref 65–99)

## 2016-06-26 LAB — CULTURE, BLOOD (ROUTINE X 2)
CULTURE: NO GROWTH
CULTURE: NO GROWTH

## 2016-06-26 LAB — HEMOGLOBIN A1C
HEMOGLOBIN A1C: 6.4 % — AB (ref 4.8–5.6)
MEAN PLASMA GLUCOSE: 137 mg/dL

## 2016-06-26 NOTE — Progress Notes (Signed)
Occupational Therapy Session Note  Patient Details  Name: Samuel Peck MRN: [id number] Date of Birth: [birth year]  Today's Date: 06/26/2016 OT Individual Time: [id number] OT Individual Time Calculation (min): 80 min   Short Term Goals: Week 1:  OT Short Term Goal 1 (Week 1): Pt will don shirt with supervision in supported chair OT Short Term Goal 2 (Week 1): Pt will tolerate circle sitting with mod A in prep for LB dressing  OT Short Term Goal 3 (Week 1): Pt will transfer via slide board bed<>w/c with max A+2  OT Short Term Goal 4 (Week 1): Pt will maintain static sitting balance on firm surface with mod A in prep for ADL tasks performed in sitting  OT Short Term Goal 5 (Week 1): Pt will perform one grooming tasks at sink with min A  Skilled Therapeutic Interventions/Progress Updates:    ADL-retraining at bed level with focus on improved activity tolerance and bed mobility.   Pt received supine in bed, HOB elevated with wife, son and grandson present.   Wife reports familiarity with care of pt and expressed interest in assisting with planned BADL session.   Wife reports expertise with care of patient d/t her professional history as Charity fundraiserN (ICU experience).    With extra time and +2 assist, OT and pt's wife as +2 helper completed lower body bathing and dressing, with pt responding to prompt and cues to clear secretions and to reach and turn to assist with bed mobility using bed rails.   Per wife's request, RN was made aware of wound issues at left foot, great toe, and buttocks.   Pt required overall total assist, +2 helper for bathing/dressing lower body.   Night bathing schedule recommended to enable focus on activity tolerance, OOB schedule reinforcement, sitting balance and UE strengthening as wife is competent to assist with BADL, as needed, s/p discharge.  Therapy Documentation Precautions:  Precautions Precautions: Fall, Other (comment) Precaution Comments: trach, PEG, T5  para Restrictions Weight Bearing Restrictions: No   Vital Signs: Therapy Vitals Pulse Rate: 78 Resp: 18 BP: (!) 114/47 Oxygen Therapy SpO2: 98 % O2 Device: Tracheostomy Collar O2 Flow Rate (L/min): 6 L/min FiO2 (%): 28 %   Pain: Pain Assessment Pain Assessment: Faces Faces Pain Scale: No hurt   ADL: ADL ADL Comments: see functional navigator   Other Treatments:    See Function Navigator for Current Functional Status.   Therapy/Group: Individual Therapy  Samuel Peck 06/26/2016, 12:54 PM

## 2016-06-26 NOTE — Progress Notes (Signed)
Lots on Rain out in the ATC set up. reservoir bag empty. Pt is sleeping comfortably at this time

## 2016-06-26 NOTE — Progress Notes (Signed)
*  PRELIMINARY RESULTS* Vascular Ultrasound Bilateral lower extremity venous duplex has been completed.  Preliminary findings: No evidence of deep vein thrombosis or baker's cysts bilaterally.   Samuel DonningCharlotte Peck Samuel Peck 06/26/2016, 3:00 PM

## 2016-06-26 NOTE — Progress Notes (Addendum)
Physical Therapy Session Note  Patient Details  Name: Samuel LeventhalCharles Aure MRN: [id number] Date of Birth: 1941/05/21  Today's Date: 06/26/2016 PT Individual Time: [id number] and 1345-1415 PT Individual Time Calculation (min): 75 min and 30 min   Short Term Goals: Week 1:  PT Short Term Goal 1 (Week 1): Pt will initiate bed mobility training with leg loops, bed rails and max A of one person PT Short Term Goal 2 (Week 1): Pt will tolerate sitting edge of mat x 5 minutes with max A of one person performing dynamic sitting balance activities PT Short Term Goal 3 (Week 1): Pt will perform bed <> w/c transfers with slideboard and max A of one person on level surfaces PT Short Term Goal 4 (Week 1): Pt will tolerate sitting up in tilt in space w/c x 4 hours calling for boosting/pressure relief every 30 min with 50% reminder cues  Skilled Therapeutic Interventions/Progress Updates:  Treatment 1: Patient in bed upon arrival with Sp02 >97% and HR in the 70's on 6 L/min 28% Fi02 via trach collar. Patient with increased difficulty with coughing and clearing secretions throughout session and unable to perform true quad cough due to PEG but therapist applied pressure against patient's abdomen on either side of PEG to assist with stronger cough and patient utilized yonker for suction with setup assist (patient declined need to call respiratory). Performed PROM BLE for heel cords, hamstrings, hip IR, hip ER, and hip adduction for muscle extensibility and ROM. [facility] with +2A due to skin breakdown on buttocks, pulling up on therapist with BUE to place sling underneath him. After transferring to wheelchair, patient c/o "not being able to breathe" with 02 sats dropping to 86-87% and HR increasing to 90s requiring increased time to increase to 90% Sp02. Patient tolerated OOB x 5 min but then returned to bed due to difficulty with breathing and sats returned to normal upon return to  bed. Initiated OOB schedule to track activity tolerance and pressure relief. Patient left semi reclined in bed with PRAFO donned (only 1 boot in room, RN notified) and family in room.   Treatment 2: Patient in TIS wheelchair upon arrival with Sp02 97% on 6 L/min 28% Fi02 via trach collar and HR in the 70's. Patient tolerated sitting OOB x 2 hours since OT session with BP 140/49 but fatigued and requesting to return to bed. Utilized Maximove due to skin breakdown on buttocks to transfer back to air mattress overlay bed with +2 A. Patient used BUE to pull up on therapist into long sitting position to remove sling and repositioned in bed with +2A. Patient left semi reclined with 1 PRAFO boot donned (RN made aware only one boot in room) and other heel floated on pillow with call bell and yonker in reach.   Therapy Documentation Precautions:  Precautions Precautions: Fall, Other (comment) Precaution Comments: trach, PEG, T5 para Restrictions Weight Bearing Restrictions: No Pain: Pain Assessment Pain Assessment: Faces Faces Pain Scale: No hurt   See Function Navigator for Current Functional Status.   Therapy/Group: Individual Therapy  Lovey Crupi, Prudencio PairRebecca A 06/26/2016, 12:33 PM

## 2016-06-26 NOTE — Progress Notes (Signed)
CPT held patient was to rest. Pt was resting and sleep on arrival. SATs are stable no distress or complications noted

## 2016-06-26 NOTE — Progress Notes (Signed)
Samuel Peck is a 75 y.o. male December 22, 1940 [id number]  Subjective: No new complaints. Chart reviewed and discussed pending WOC consult with RN  Objective: Vital signs in last 24 hours: Temp:  [97.5 F (36.4 C)-97.8 F (36.6 C)] 97.8 F (36.6 C) (12/17 0415) Pulse Rate:  [78-92] 78 (12/17 1019) Resp:  [18-22] 18 (12/17 1019) BP: (114-128)/(47-70) 114/47 (12/17 0937) SpO2:  [96 %-100 %] 98 % (12/17 1019) FiO2 (%):  [28 %] 28 % (12/17 1019) Weight change:  Last BM Date: 06/25/16  Intake/Output from previous day: 12/16 0701 - 12/17 0700 In: 400 [NG/GT:400] Out: 1300 [Urine:1300]  Physical Exam General: No apparent distress   RT at side Lungs: Normal effort. Lungs clear to auscultation anteriorly  Cardiovascular: irregular rate and rhythm Wounds: Sacral wound not examined by me  Lab Results: BMET    Component Value Date/Time   NA 138 06/25/2016 0428   K 3.4 (L) 06/25/2016 0428   CL 100 (L) 06/25/2016 0428   CO2 26 06/25/2016 0428   GLUCOSE 182 (H) 06/25/2016 0428   BUN 17 06/25/2016 0428   CREATININE 0.38 (L) 06/25/2016 0428   CALCIUM 8.4 (L) 06/25/2016 0428   GFRNONAA >60 06/25/2016 0428   GFRAA >60 06/25/2016 0428   CBC    Component Value Date/Time   WBC 10.1 06/25/2016 0428   RBC 3.90 (L) 06/25/2016 0428   RBC 3.90 (L) 06/25/2016 0428   HGB 10.2 (L) 06/25/2016 0428   HCT 33.8 (L) 06/25/2016 0428   PLT 262 06/25/2016 0428   MCV 86.7 06/25/2016 0428   MCH 26.2 06/25/2016 0428   MCHC 30.2 06/25/2016 0428   RDW 16.7 (H) 06/25/2016 0428   LYMPHSABS 0.9 06/25/2016 0428   MONOABS 0.4 06/25/2016 0428   EOSABS 0.3 06/25/2016 0428   BASOSABS 0.0 06/25/2016 0428   CBG's (last 3):    Recent Labs  06/25/16 2329 06/26/16 0410 06/26/16 0808  GLUCAP 207* 200* 230*   No results found for: HGBA1C  LFT's Lab Results  Component Value Date   ALT 82 (H) 06/25/2016   AST 61 (H) 06/25/2016   ALKPHOS 135 (H) 06/25/2016   BILITOT 0.4 06/25/2016     Studies/Results: No results found.  Medications:  I have reviewed the patient's current medications. Scheduled Medications: . atorvastatin  10 mg Per Tube q1800  . chlorhexidine  15 mL Mouth Rinse BID  . cholestyramine  4 g Per Tube BID  . enoxaparin (LOVENOX) injection  40 mg Subcutaneous Q24H  . famotidine  20 mg Per Tube BID  . flecainide  100 mg Per Tube Q12H  . free water  200 mL Per Tube TID WC & HS  . hydrocerin   Topical BID  . insulin aspart  0-9 Units Subcutaneous Q4H  . insulin glargine  10 Units Subcutaneous BID  . mouth rinse  15 mL Mouth Rinse q12n4p  . memantine  10 mg Per Tube BID  . meropenem (MERREM) IV  1 g Intravenous Q8H  . metoprolol tartrate  25 mg Per Tube BID  . multivitamin  15 mL Per Tube Daily  . vitamin B-6  25 mg Per Tube Daily  . sodium chloride HYPERTONIC  4 mL Nebulization BID  . vitamin C  500 mg Per Tube BID  . 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 Functional and mobility deficitssecondary to T5 fracture/SCI with paraplegia. Multiple post- injury complications  -Continue CIR and ongoing care 2. DVT Prophylaxis/Anticoagulation: Pharmaceutical: Lovenox/ consider resuming eliquis? Discuss with surgery next week 3. Pain Management: tylenol, tramadol for more severe pain 4. Mood: team to provide ego support 5. Neuropsych: This patient is capable of making decisions on hisown behalf. 6. Skin/Wound Care: Air  mattress overlay. ordered additonal prevealon boot for use when in bed. Eucerin cream to bilateral feet. Maintain adequate nutritional and hydration status. 7. Fluids/Electrolytes/Nutrition: Strict NPO at this time. Consult dietician to assist with tube feeds. Will change to more elemental form to see if this helps with ongoing diarrhea. D/C IVF and increase water flushes. Replace K+ 8. Sacral decub Stage II: Has has a lot of muscle loss--added protein supplement to tube feeds. Add additional vitamins to help promote healing. Pending consult of WOC for input--? Accyzume with wet to dry dressing to sacrum.  9. Pseudomonas Aeruginosa UTI with Urosepsis: Has been afebrile on antibiotics. To continue meropenum for 7 days per IM. Will change out foley as has been in 30 days.  10. Acute renal failure: Resolved. Check BMet repeat in 48 hours to monitor for stability.  11. VDRF with tracheostomy: Case discussed with PCCM NP prior to transfer to CIR--to continue CFS # 6 cough/endurance improves. Ordered chest vest as well as hypertonic saline nebs X 48 hours to help with mobilize mucous/atelectasis.  12. H/o A fib s/p DCCV: Off Eliquis due to trauma. Monitor HR qid.  13. Recent C diff colitis: treated. Monitor for recurrence with antibiotics on board again for UTI 14. Dysphagia: Continue NPO status. Speech therapy for PMSV and trials of ice chips as indicated. 15. Hyperglycemia: Likely due to illness/tube feeds--check Hgb A1c- monitor BS every 4 hours for now.  16. Protein calorie Malnutrition: Will increase rate of TF for now till evaluated by RD.   Length of stay, days: 2   Katerina Zurn A. Felicity CoyerLeschber, MD 06/26/2016, 10:46 AM

## 2016-06-27 ENCOUNTER — Inpatient Hospital Stay (HOSPITAL_COMMUNITY): Payer: No Typology Code available for payment source

## 2016-06-27 ENCOUNTER — Inpatient Hospital Stay (HOSPITAL_COMMUNITY): Payer: Medicare Other | Admitting: Physical Therapy

## 2016-06-27 ENCOUNTER — Inpatient Hospital Stay (HOSPITAL_COMMUNITY): Payer: Medicare Other | Admitting: Speech Pathology

## 2016-06-27 ENCOUNTER — Inpatient Hospital Stay (HOSPITAL_COMMUNITY): Payer: No Typology Code available for payment source | Admitting: Occupational Therapy

## 2016-06-27 ENCOUNTER — Inpatient Hospital Stay (HOSPITAL_COMMUNITY): Payer: No Typology Code available for payment source | Admitting: Speech Pathology

## 2016-06-27 DIAGNOSIS — I480 Paroxysmal atrial fibrillation: Secondary | ICD-10-CM

## 2016-06-27 DIAGNOSIS — D62 Acute posthemorrhagic anemia: Secondary | ICD-10-CM

## 2016-06-27 DIAGNOSIS — D72829 Elevated white blood cell count, unspecified: Secondary | ICD-10-CM

## 2016-06-27 DIAGNOSIS — E119 Type 2 diabetes mellitus without complications: Secondary | ICD-10-CM

## 2016-06-27 LAB — GLUCOSE, CAPILLARY
GLUCOSE-CAPILLARY: 225 mg/dL — AB (ref 65–99)
GLUCOSE-CAPILLARY: 199 mg/dL — AB (ref 65–99)
Glucose-Capillary: 206 mg/dL — ABNORMAL HIGH (ref 65–99)
Glucose-Capillary: 194 mg/dL — ABNORMAL HIGH (ref 65–99)
Glucose-Capillary: 218 mg/dL — ABNORMAL HIGH (ref 65–99)

## 2016-06-27 LAB — CBC
HEMATOCRIT: 34.9 % — AB (ref 39.0–52.0)
HEMOGLOBIN: 10.3 g/dL — AB (ref 13.0–17.0)
MCH: 25.7 pg — AB (ref 26.0–34.0)
MCHC: 29.5 g/dL — ABNORMAL LOW (ref 30.0–36.0)
MCV: 87 fL (ref 78.0–100.0)
Platelets: 267 10*3/uL (ref 150–400)
RBC: 4.01 MIL/uL — AB (ref 4.22–5.81)
RDW: 16.9 % — ABNORMAL HIGH (ref 11.5–15.5)
WBC: 11.1 10*3/uL — ABNORMAL HIGH (ref 4.0–10.5)

## 2016-06-27 LAB — BASIC METABOLIC PANEL
Anion gap: 6 (ref 5–15)
BUN: 20 mg/dL (ref 6–20)
CHLORIDE: 105 mmol/L (ref 101–111)
CO2: 29 mmol/L (ref 22–32)
Calcium: 8.1 mg/dL — ABNORMAL LOW (ref 8.9–10.3)
Creatinine, Ser: 0.35 mg/dL — ABNORMAL LOW (ref 0.61–1.24)
GFR calc Af Amer: >60 mL/min (ref >60)
GFR calc non Af Amer: >60 mL/min (ref >60)
GLUCOSE: 204 mg/dL — AB (ref 65–99)
POTASSIUM: 4.4 mmol/L (ref 3.5–5.1)
Sodium: 140 mmol/L (ref 135–145)

## 2016-06-27 MED ORDER — FLUCONAZOLE 40 MG/ML PO SUSR
100.0000 mg | Freq: Every day | ORAL | Status: AC
  Administered 2016-06-27 – 2016-07-01 (×5): 100 mg
  Filled 2016-06-27 (×5): qty 2.5

## 2016-06-27 MED ORDER — NYSTATIN 100000 UNIT/GM EX CREA
TOPICAL_CREAM | Freq: Two times a day (BID) | CUTANEOUS | Status: DC
  Administered 2016-06-27 – 2016-07-06 (×19): via TOPICAL
  Administered 2016-07-06: 1 via TOPICAL
  Administered 2016-07-07 – 2016-07-08 (×2): via TOPICAL
  Filled 2016-06-27 (×3): qty 15

## 2016-06-27 MED ORDER — CHLORHEXIDINE GLUCONATE 0.12 % MT SOLN
15.0000 mL | Freq: Four times a day (QID) | OROMUCOSAL | Status: DC
  Administered 2016-06-27 – 2016-07-07 (×38): 15 mL via OROMUCOSAL
  Filled 2016-06-27 (×32): qty 15

## 2016-06-27 MED ORDER — IPRATROPIUM-ALBUTEROL 0.5-2.5 (3) MG/3ML IN SOLN
3.0000 mL | Freq: Two times a day (BID) | RESPIRATORY_TRACT | Status: DC
Start: 2016-06-27 — End: 2016-06-29
  Administered 2016-06-27 – 2016-06-28 (×3): 3 mL via RESPIRATORY_TRACT
  Filled 2016-06-27 (×2): qty 3

## 2016-06-27 NOTE — Progress Notes (Signed)
Pt is sleeping comfortably at this time no distress or complications noted.  
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
## 2016-06-27 NOTE — Progress Notes (Addendum)
Speech Language Pathology Daily Session Note  Patient Details  Name: Samuel Peck MRN: [id number] Date of Birth: 15-Aug-1940  Today's Date: 06/27/2016 SLP Individual Time: 1415-1500 SLP Individual Time Calculation (min): 45 min and Today's Date: 06/27/2016 SLP Missed Time: 15 Minutes Missed Time Reason: Patient fatigue;Other (Comment) (report of SOB with RN and resp. in room )    Short Term Goals:Week 1: SLP Short Term Goal 1 (Week 1): Pt will wear PMSV for 60 minutes without changes in vital signs or complaints of discomfort over 2 consecutive sessions.   SLP Short Term Goal 2 (Week 1): Pt will consume therapeutic trials of ice chips without overt s/s of aspiration and min assist for use of swallowing precautions over 3 consecutive sessions prior to repeat objective assessment.  SLP Short Term Goal 3 (Week 1): Pt will orally expectorate secretions in 50% of opportunities with min assist verbal cues.   SLP Short Term Goal 4 (Week 1): Pt will utilize increased vocal intensity and overarticulation to achieve intelligibility at the phrase level with min assist verbal cues.   SLP Short Term Goal 5 (Week 1): Pt will utilize external aids to facilitate recall of daily information with min assist verbal cues.    Skilled Therapeutic Interventions: Skilled treatment session focused on addressing PMSV and swallow goals; however, session was limited by reports of being SOB and as a result progressed as far as oral care with ces due to cognitive deficits.  SLP facilitated session by donning PMSV for ~5 minutes prior to patient reporting that he felt SOB.  SpO2 92-94%, Pulse 80-80; Respiratory Rate 32.  RN notified and provided deep suctioning for audible congestion that patient was unable to expectorate.  PMSV re-donned for ~3 minutes when patient again asked for it to be removed due feeling that he could not get enough air.  RN again notified and respiratory paged.  Following set-up, patient completed oral  care via suctioning with Mod verbal, visual, and tactile cues to recall instructions to fully occlude with finger and thoroughly recall procedure instructions.  Patient continued to express feeling very SOB with inability to get enough air.  Patient reclined back in bed more and session ended with RN and respiratory therapy in room.  PA Samuel Peck made aware.  Continue with current plan of care.    Function:  Eating Eating Eating activity did not occur: Safety/medical concerns Modified Consistency Diet: No             Cognition Comprehension Comprehension assist level: Understands basic 90% of the time/cues < 10% of the time  Expression Expression assistive device: Talk trach valve Expression assist level: Expresses basic 50 - 74% of the time/requires cueing 25 - 49% of the time. Needs to repeat parts of sentences.  Social Interaction Social Interaction assist level: Interacts appropriately 90% of the time - Needs monitoring or encouragement for participation or interaction.  Problem Solving Problem solving assist level: Solves basic 50 - 74% of the time/requires cueing 25 - 49% of the time  Memory Memory assist level: Recognizes or recalls 75 - 89% of the time/requires cueing 10 - 24% of the time    Pain Pain Assessment Pain Assessment: No/denies pain  Therapy/Group: Individual Therapy  Samuel FerrettiMelissa Dreyson Peck, M.A., CCC-SLP [phone]  Samuel Peck 06/27/2016, 3:05 PM

## 2016-06-27 NOTE — Progress Notes (Signed)
Patient in therapy during time of first rounds.  Unable to perform CPT at this time.

## 2016-06-27 NOTE — Progress Notes (Signed)
[facility] PHYSICAL MEDICINE & REHABILITATION     PROGRESS NOTE  Subjective/Complaints:  Pt seen laying in bed this AM.  He indicated that she slept well overnight.  He notes the room is warm and would like to decrease the temp.  ROS: Denies CP, SOB, N/V/D.  Objective: Vital Signs: Blood pressure 133/70, pulse 77, temperature 98.1 F (36.7 C), temperature source Oral, resp. rate 18, height 6\' 2"  (1.88 m), weight 88 kg (194 lb), SpO2 96 %. No results found.  Recent Labs  06/25/16 0428 06/27/16 0430  WBC 10.1 11.1*  HGB 10.2* 10.3*  HCT 33.8* 34.9*  PLT 262 267    Recent Labs  06/25/16 0428 06/27/16 0430  NA 138 140  K 3.4* 4.4  CL 100* 105  GLUCOSE 182* 204*  BUN 17 20  CREATININE 0.38* 0.35*  CALCIUM 8.4* 8.1*   CBG (last 3)   Recent Labs  06/26/16 2346 06/27/16 0428 06/27/16 0906  GLUCAP 193* 206* 194*    Wt Readings from Last 3 Encounters:  06/24/16 88 kg (194 lb)    Physical Exam:  BP 133/70 (BP Location: Right Arm)   Pulse 77   Temp 98.1 F (36.7 C) (Oral)   Resp 18   Ht 6\' 2"  (1.88 m)   Wt 88 kg (194 lb)   SpO2 96%   BMI 24.91 kg/m  Constitutional: No distress. Vital signs reviewed.  HENT: Normocephalic. Atraumatic Eyes: EOMI. No discharge.  Neck: +trach.  Cardiovascular: Normal rate. No JVD. Respiratory: Effort normal. He has no wheezes. GI: Soft. Bowel sounds are normal.  Musculoskeletal: He exhibits no edema, no tenderness.  Neurological: Alert. Motor: UE grossly 4+/5 proximal to distal. B/l LE: 0/5 Sensation absent to light touch b/l LE No increase in tone noted.  Skin:  Stage II sacral decubitus, not examined today Small DTI on 1st digit LLE Psychiatric:  Somewhat flat but cooperative   Assessment/Plan: 1. Functional deficits secondary to T5 fracture/SCI with paraplegia which require 3+ hours per day of interdisciplinary therapy in a comprehensive inpatient rehab setting. Physiatrist is providing close team supervision  and 24 hour management of active medical problems listed below. Physiatrist and rehab team continue to assess barriers to discharge/monitor patient progress toward functional and medical goals.  Function:  Bathing Bathing position   Position: Bed  Bathing parts Body parts bathed by patient: Right arm, Left arm, Chest Body parts bathed by helper: Buttocks, Right upper leg, Left upper leg, Right lower leg, Left lower leg  Bathing assist Assist Level: 2 helpers      Upper Body Dressing/Undressing Upper body dressing   What is the patient wearing?: Pull over shirt/dress     Pull over shirt/dress - Perfomed by patient: Thread/unthread right sleeve, Thread/unthread left sleeve Pull over shirt/dress - Perfomed by helper: Pull shirt over trunk, Put head through opening        Upper body assist Assist Level: 2 helpers      Lower Body Dressing/Undressing Lower body dressing   What is the patient wearing?: Pants, Socks, Shoes       Pants- Performed by helper: Thread/unthread right pants leg, Thread/unthread left pants leg, Pull pants up/down, Fasten/unfasten pants       Socks - Performed by helper: Don/doff right sock, Don/doff left sock   Shoes - Performed by helper: Don/doff right shoe, Don/doff left shoe, Fasten right, Fasten left          Lower body assist Assist for lower body dressing: 2 Helpers  Toileting Toileting Toileting activity did not occur: Safety/medical concerns        Toileting assist     Transfers Chair/bed transfer   Chair/bed transfer method: Other Chair/bed transfer assist level: 2 helpers Chair/bed transfer assistive device: Mechanical lift Mechanical lift: Maximove   Locomotion Ambulation Ambulation activity did not occur: Safety/medical concerns         Wheelchair   Type: Motorized Max wheelchair distance: 150 Assist Level: Total assistance (Pt < 25%)  Cognition Comprehension Comprehension assist level: Understands basic 90% of  the time/cues < 10% of the time  Expression Expression assist level: Expresses basic 50 - 74% of the time/requires cueing 25 - 49% of the time. Needs to repeat parts of sentences.  Social Interaction Social Interaction assist level: Interacts appropriately 90% of the time - Needs monitoring or encouragement for participation or interaction.  Problem Solving Problem solving assist level: Solves basic 50 - 74% of the time/requires cueing 25 - 49% of the time  Memory Memory assist level: Recognizes or recalls 75 - 89% of the time/requires cueing 10 - 24% of the time     Medical Problem List and Plan: 1. Functional and mobility deficitssecondary to T5 fracture/SCI with paraplegia. Multiple post- injury complications  Cont CIR  RLE PRAFO ordered 2. DVT Prophylaxis/Anticoagulation: Pharmaceutical: Lovenox/ consider resuming eliquis?   Discuss with surgery this week 3. Pain Management: tylenol, tramadol for more severe pain 4. Mood: team to provide ego support 5. Neuropsych: This patient is capable of making decisions on hisown behalf. 6. Skin/Wound Care: Air mattress overlay. Eucerin cream to bilateral feet. Maintain adequate nutritional and hydration status.   7. Fluids/Electrolytes/Nutrition:   Strict NPO at this time.   Consult dietician to assist with tube feeds.   Changed to more elemental form to see if this helps with ongoing diarrhea.   D/Ced IVF and increase water flushes. 8. Sacral decub Stage II: Has has a lot of muscle loss--added protein supplement to tube feeds. Added additional vitamins to help promote healing. WOC  9. Pseudomonas Aeruginosa UTI with Urosepsis: Has been afebrile on antibiotics.   Continue meropenum for 7 days per IM.   Foley changed, as has been in 30 days.  10. Acute renal failure: Resolved.   CR within acceptable limits 12/18 11. VDRF with tracheostomy: Case discussed with PCCM NP--to continue CFS # 6 cough/endurance improves.   Ordered chest vest as  well as hypertonic saline nebs X 48 hours to help with mobilize mucous/atelectasis.  12. H/o A fib s/p DCCV: Off Eliquis due to trauma. Monitor HR qid.  13. Recent C diff colitis: treated. Monitor for recurrence with antibiotics on board again.  14. Dysphagia: Continue NPO status. Speech therapy for PMSV and trials of ice chips as indicated. 15. DM type 2:   Hgb A1c 6.4  Monitor BS every 4 hours  Cont Lantus 10 BID  Will make further adjustments as necessary. 16. Protein calorie Malnutrition: Increased rate of TF for until evaluated by RD.  17. Leukocytosis  WBCs 11.1 on 12/18  Cont to monitor 18. ABLA  Hb 10.3 on 12/18  Cont to monitor  LOS (Days) 3 A FACE TO FACE EVALUATION WAS PERFORMED  Samuel Peck 06/27/2016 9:29 AM

## 2016-06-27 NOTE — Progress Notes (Signed)
Physical Therapy Session Note  Patient Details  Name: Samuel LeventhalCharles Peck MRN: [id number] Date of Birth: 01/30/1941  Today's Date: 06/27/2016 PT Individual Time: 0900-1000 PT Individual Time Calculation (min): 60 min    Short Term Goals: Week 1:  PT Short Term Goal 1 (Week 1): Pt will initiate bed mobility training with leg loops, bed rails and max A of one person PT Short Term Goal 2 (Week 1): Pt will tolerate sitting edge of mat x 5 minutes with max A of one person performing dynamic sitting balance activities PT Short Term Goal 3 (Week 1): Pt will perform bed <> w/c transfers with slideboard and max A of one person on level surfaces PT Short Term Goal 4 (Week 1): Pt will tolerate sitting up in tilt in space w/c x 4 hours calling for boosting/pressure relief every 30 min with 50% reminder cues  Skilled Therapeutic Interventions/Progress Updates:   Pt received seated in w/c, denies pain and agreeable to treatment. Pt on 6L O2 via trach collar and O2 >94% throughout session. Transfer w/c <>mat table with maxi move and +2A. Sitting balance on edge of mat table variable min guard>max/total with posterior LOB. Pt educated in head positioning for maintaining/regaining balance, and able to return demonstration for weight shifting R/L and anterior/posterior with head to A with gaining and regaining balance. Pt able to alternating UEs from edge of mat to lap and return to mat for support. Upright sitting tolerance with S and BUE support for 2 min before requiring semi-reclined rest break due to SOB. Transferred back to w/c with maximove as above. Pt requests to return to bed; utilizes maxi move and +2A as above. Rolling R/L with maxA and bedrails to remove sling. Remained supine in bed with all needs in reach and wife present. RN alerted to pitting edema on RUE, and UE elevated on pillow at end of session.   Therapy Documentation Precautions:  Precautions Precautions: Fall, Other (comment) Precaution  Comments: trach, PEG, T5 para Restrictions Weight Bearing Restrictions: No Vital Signs: Therapy Vitals Pulse Rate: 76 Resp: 18 Patient Position (if appropriate): Lying Oxygen Therapy SpO2: 96 % O2 Device: Tracheostomy Collar O2 Flow Rate (L/min): 6 L/min FiO2 (%): 28 % Pain: Pain Assessment Pain Assessment: No/denies pain   See Function Navigator for Current Functional Status.   Therapy/Group: Individual Therapy  [name] 06/27/2016, 3:56 PM

## 2016-06-27 NOTE — Progress Notes (Signed)
Orthopedic Tech Progress Note Patient Details:  Samuel LeventhalCharles Peck [birth year] [id number]  Patient ID: Samuel Leventhalharles Peck, male   DOB: [birth year], 75 y.o.   MRN: [id number]   Nikki DomCrawford, [name] 06/27/2016, 10:43 AM Called in advanced brace order; spoke with Juanda ChanceKanisha

## 2016-06-27 NOTE — Consult Note (Signed)
WOC Nurse wound consult note Reason for Consult: Consult requested for buttocks. Wound type: Pt has previously been incontinent of stool and is frequently moist to the inner gluteal cleft.  He has a full thickness fissure related to moisture; this is NOT a pressure injury, but was present on admission. Measurement: 2X.2X.2cm to the inner gluteal fold Wound bed: red and moist Drainage (amount, consistency, odor) Mod amt yellow drainage Dressing procedure/placement/frequency: Aquacel to absorb drainage and foam dressing to protect from further injury. Please re-consult if further assistance is needed.  Thank-you,  Cammie Mcgeeawn Parsa Rickett MSN, RN, CWOCN, [place]-AP, CNS [phone]

## 2016-06-27 NOTE — Progress Notes (Signed)
Speech Language Pathology Daily Session Note  Patient Details  Name: Samuel LeventhalCharles Peck MRN: [id number] Date of Birth: 03-06-1941  Today's Date: 06/27/2016 SLP Individual Time: 1030-1100 SLP Individual Time Calculation (min): 30 min   Short Term Goals: Week 1: SLP Short Term Goal 1 (Week 1): Pt will wear PMSV for 60 minutes without changes in vital signs or complaints of discomfort over 2 consecutive sessions.   SLP Short Term Goal 2 (Week 1): Pt will consume therapeutic trials of ice chips without overt s/s of aspiration and min assist for use of swallowing precautions over 3 consecutive sessions prior to repeat objective assessment.  SLP Short Term Goal 3 (Week 1): Pt will orally expectorate secretions in 50% of opportunities with min assist verbal cues.   SLP Short Term Goal 4 (Week 1): Pt will utilize increased vocal intensity and overarticulation to achieve intelligibility at the phrase level with min assist verbal cues.   SLP Short Term Goal 5 (Week 1): Pt will utilize external aids to facilitate recall of daily information with min assist verbal cues.    Skilled Therapeutic Interventions: Skilled treatment session focused on PMV toleration and oral care. Wife present and PMV in place. Pt with throat clears present upon SLP entering room. Throat clears sound dry and didn't appear indicative of pharyngeal secretions. SLP attempted oral care and found copious amounts of dried secretions to posterior tongue base and posterior pharyngeal wall. Once all secretions cleared, pt's throat clears resolved. Nursing present and SLP requested pt to receive increased oral care at least every 4 hours. Pt tolerated PMV placement as evidenced by stable vitals. Pt required Max A verbal cues to increase vocal intensity and was ~50% at the phrase level. Pt was left in bed with wife and nursing present. Continue current plan of care.   Function:  Eating Eating Eating activity did not occur: Safety/medical  concerns Modified Consistency Diet: No             Cognition Comprehension Comprehension assist level: Understands basic 90% of the time/cues < 10% of the time  Expression Expression assistive device: Talk trach valve Expression assist level: Expresses basic 50 - 74% of the time/requires cueing 25 - 49% of the time. Needs to repeat parts of sentences.  Social Interaction Social Interaction assist level: Interacts appropriately 90% of the time - Needs monitoring or encouragement for participation or interaction.  Problem Solving Problem solving assist level: Solves basic 50 - 74% of the time/requires cueing 25 - 49% of the time  Memory Memory assist level: Recognizes or recalls 75 - 89% of the time/requires cueing 10 - 24% of the time    Pain    Therapy/Group: Individual Therapy  Kimani Bedoya 06/27/2016, 12:46 PM

## 2016-06-27 NOTE — Progress Notes (Signed)
MEDICATION RELATED NOTE  Pharmacy Re:  Home Meds  Patients med list has been pulled from fill reports.  This cannot be confirmed but appears to be what the patient was taking at [facility].  Plan:  I have marked his record complete.  Please notify us if new information or someone is able to identify his medications and we will update his list.  Chinita GreenlandJohnston, Blakely Gluth Faye 06/27/2016,10:47 AM

## 2016-06-27 NOTE — Progress Notes (Signed)
CPT held pt wants to rest

## 2016-06-27 NOTE — Progress Notes (Signed)
Inpatient Diabetes Program Recommendations  AACE/ADA: New Consensus Statement on Inpatient Glycemic Control (2015)  Target Ranges:  Prepandial:   less than 140 mg/dL      Peak postprandial:   less than 180 mg/dL (1-2 hours)      Critically ill patients:  140 - 180 mg/dL   Lab Results  Component Value Date   GLUCAP 218 (H) 06/27/2016   HGBA1C 6.4 (H) 06/25/2016   Results for Samuel Peck, Samuel Peck (MRN [id number]) as of 06/27/2016 15:24  Ref. Range 06/26/2016 19:48 06/26/2016 23:46 06/27/2016 04:28 06/27/2016 09:06 06/27/2016 11:48  Glucose-Capillary Latest Ref Range: 65 - 99 mg/dL 409171 (H) 811193 (H) 914206 (H) 194 (H) 218 (H)   Review of Glycemic Control  Diabetes history:     DM2, active wound Outpatient Diabetes medications:     none Current orders for Inpatient glycemic control:     Novolog 0-9 units Q4H, Lantus 10 units BID  Inpatient Diabetes Program Recommendations:    While receiving continuous tube feedings, please consider Novolog 2 units Q4H.  Thank you,  Kristine LineaKaren Julia Alkhatib, RN, BSN Diabetes Coordinator Inpatient Diabetes Program [phone] (Team Pager)

## 2016-06-27 NOTE — Progress Notes (Signed)
Occupational Therapy Session Note  Patient Details  Name: Samuel Peck MRN: [id number] Date of Birth: Dec 17, 1940  Today's Date: 06/27/2016 OT Individual Time: 0730-0830 OT Individual Time Calculation (min): 60 min     Short Term Goals:Week 1:  OT Short Term Goal 1 (Week 1): Pt will don shirt with supervision in supported chair OT Short Term Goal 2 (Week 1): Pt will tolerate circle sitting with mod A in prep for LB dressing  OT Short Term Goal 3 (Week 1): Pt will transfer via slide board bed<>w/c with max A+2  OT Short Term Goal 4 (Week 1): Pt will maintain static sitting balance on firm surface with mod A in prep for ADL tasks performed in sitting  OT Short Term Goal 5 (Week 1): Pt will perform one grooming tasks at sink with min A  Skilled Therapeutic Interventions/Progress Updates:    Pt seen for OT session focusing on bed mobility, upright tolerance and ADL re-training. Pt in supine upon arrival with MD present completing morning [name]. Pt agreeable to tx session, denying pain. PMSV donned at bed level, however, once up in chair pt reported difficulty breakthing (O2 remaining 90 or greater on 6L throughout session), therefore PMSV removed and pt voiced easier breathing. He rolled with +2 assist, using UEs on bed rail to assist. Pants donned and maximove sling placed. Lift used to transfer pt to tilt-in-space w/c. From chair level, pt doffed and donned clean shirt with assist to pull over head when donning and removing UE when doffing. Pt washed face seated in w/c with set-up. Pt left semi tilted in w/c at end of session, all needs in reach. Encouraged pt to stay up in chair until PT session 30 minutes following conclusion of OT session.   Therapy Documentation Precautions:  Precautions Precautions: Fall, Other (comment) Precaution Comments: trach, PEG, T5 para Restrictions Weight Bearing Restrictions: No Pain:   No/ denies pain ADL: ADL ADL Comments: see functional  navigator  See Function Navigator for Current Functional Status.   Therapy/Group: Individual Therapy  [name], Sanah Kraska C 06/27/2016, 7:13 AM

## 2016-06-27 NOTE — Progress Notes (Signed)
Patient information reviewed and entered into eRehab system by Chayim Bialas, RN, CRRN, PPS Coordinator.  Information including medical coding and functional independence measure will be reviewed and updated through discharge.     Per nursing patient was given "Data Collection Information Summary for Patients in Inpatient Rehabilitation Facilities with attached "Privacy Act Statement-Health Care Records" upon admission.  

## 2016-06-27 NOTE — Progress Notes (Signed)
Inpatient Diabetes Program Recommendations  AACE/ADA: New Consensus Statement on Inpatient Glycemic Control (2015)  Target Ranges:  Prepandial:   less than 140 mg/dL      Peak postprandial:   less than 180 mg/dL (1-2 hours)      Critically ill patients:  140 - 180 mg/dL   Lab Results  Component Value Date   GLUCAP 218 (H) 06/27/2016   HGBA1C 6.4 (H) 06/25/2016   Results for Samuel Peck, [place] (MRN [id number]) as of 06/27/2016 15:24  Ref. Range 06/26/2016 19:48 06/26/2016 23:46 06/27/2016 04:28 06/27/2016 09:06 06/27/2016 11:48  Glucose-Capillary Latest Ref Range: 65 - 99 mg/dL 409171 (H) 811193 (H) 914206 (H) 194 (H) 218 (H)   Review of Glycemic Control  Diabetes history:     DM2, active wound Outpatient Diabetes medications:     none Current orders for Inpatient glycemic control:     Novolog 0-9 units Q4H, Lantus 10 units BID  Inpatient Diabetes Program Recommendations:    While receiving continuous tube feedings, please consider Novolog 2 units Q4H for carbohydrate coverage.  Thank you,  Kristine LineaKaren Mykenzie Ebanks, RN, BSN Diabetes Coordinator Inpatient Diabetes Program [phone] (Team Pager)

## 2016-06-27 NOTE — Progress Notes (Signed)
Social Work Patient ID: Samuel Peck, male   DOB: 07/03/1941, 75 y.o.   MRN: [id number]   CSW briefly met with pt who had been sleeping prior to CSW visit.  He has trach collar at 28% and did not have PMV in place, so we did some yes/no questions and pt gave CSW permission to speak with his wife.  CSW will call or visit with wife here and then full assessment will follow.  

## 2016-06-27 NOTE — Progress Notes (Signed)
Nursing administration questioning removal of foley. Following discussed with nursing 1. He has failed one voiding trial at [facility]. He developed Urosepsis 12/10 and was started on meropenum.  2. He continues to have liquid stools due to repeat antibiotic regimen from recurrent infections. Will need to get bowel program set before removing foley 3. Despite air mattress overlay and ongoing tube feeds--Has sacral breakdown with significant muscle wasting and low albumin levels that is going to delay wound healing.

## 2016-06-27 NOTE — Plan of Care (Signed)
Problem: SCI BLADDER ELIMINATION Goal: RH STG MANAGE BLADDER WITH MEDICATION WITH ASSISTANCE STG Manage Bladder With Medication With Assistance. Currently has foley. Foley care every shift and PRN  Outcome: Not Progressing Foley

## 2016-06-28 ENCOUNTER — Inpatient Hospital Stay (HOSPITAL_COMMUNITY): Payer: No Typology Code available for payment source | Admitting: Speech Pathology

## 2016-06-28 ENCOUNTER — Inpatient Hospital Stay (HOSPITAL_COMMUNITY): Payer: No Typology Code available for payment source | Admitting: Occupational Therapy

## 2016-06-28 ENCOUNTER — Inpatient Hospital Stay (HOSPITAL_COMMUNITY): Payer: Medicare Other | Admitting: Physical Therapy

## 2016-06-28 LAB — GLUCOSE, CAPILLARY
GLUCOSE-CAPILLARY: 219 mg/dL — AB (ref 65–99)
GLUCOSE-CAPILLARY: 149 mg/dL — AB (ref 65–99)
GLUCOSE-CAPILLARY: 152 mg/dL — AB (ref 65–99)
Glucose-Capillary: 123 mg/dL — ABNORMAL HIGH (ref 65–99)
Glucose-Capillary: 172 mg/dL — ABNORMAL HIGH (ref 65–99)
Glucose-Capillary: 176 mg/dL — ABNORMAL HIGH (ref 65–99)

## 2016-06-28 LAB — PROCALCITONIN: Procalcitonin: <0.1 ng/mL

## 2016-06-28 MED ORDER — PRO-STAT SUGAR FREE PO LIQD
30.0000 mL | Freq: Every day | ORAL | Status: DC
  Administered 2016-06-28 – 2016-07-08 (×11): 30 mL
  Filled 2016-06-28 (×11): qty 30

## 2016-06-28 MED ORDER — PIVOT 1.5 CAL PO LIQD
1000.0000 mL | ORAL | Status: DC
  Administered 2016-06-28 – 2016-07-05 (×7): 1000 mL
  Filled 2016-06-28 (×22): qty 1000

## 2016-06-28 MED ORDER — FUROSEMIDE 10 MG/ML IJ SOLN
40.0000 mg | Freq: Once | INTRAMUSCULAR | Status: AC
  Administered 2016-06-28: 40 mg via INTRAVENOUS
  Filled 2016-06-28: qty 4

## 2016-06-28 MED ORDER — POTASSIUM CHLORIDE 20 MEQ/15ML (10%) PO SOLN
20.0000 meq | Freq: Once | ORAL | Status: AC
  Administered 2016-06-28: 20 meq
  Filled 2016-06-28: qty 15

## 2016-06-28 MED ORDER — INSULIN GLARGINE 100 UNIT/ML ~~LOC~~ SOLN
13.0000 [IU] | Freq: Two times a day (BID) | SUBCUTANEOUS | Status: DC
  Administered 2016-06-28 – 2016-06-30 (×5): 13 [IU] via SUBCUTANEOUS
  Filled 2016-06-28 (×6): qty 0.13

## 2016-06-28 NOTE — Progress Notes (Signed)
Occupational Therapy Session Note  Patient Details  Name: Samuel LeventhalCharles Pezzullo MRN: [id number] Date of Birth: [birth year]  Today's Date: 06/28/2016 OT Individual Time: 0730-0830 OT Individual Time Calculation (min): 60 min     Short Term Goals:Week 1:  OT Short Term Goal 1 (Week 1): Pt will don shirt with supervision in supported chair OT Short Term Goal 2 (Week 1): Pt will tolerate circle sitting with mod A in prep for LB dressing  OT Short Term Goal 3 (Week 1): Pt will transfer via slide board bed<>w/c with max A+2  OT Short Term Goal 4 (Week 1): Pt will maintain static sitting balance on firm surface with mod A in prep for ADL tasks performed in sitting  OT Short Term Goal 5 (Week 1): Pt will perform one grooming tasks at sink with min A  Skilled Therapeutic Interventions/Progress Updates:    Pt seen for OT session focusing on bed mobility and tolerating upright positioning. Pt in supine upon arrival, denying pain, voiced desire to be suctioned prior to mobility, RN aware and performed suction. LB dressing completed in supine with +2 assist to roll and to don new brief and complete hygiene. Pt rolled multiple times, having to take rest breaks throughout and once having to return to supine due to O2 levels dropping while in sidelying. Pt remained >95% O2 today while in supine and up in chair. Maximove used to transfer pt to chair. UB dressing completed in chair with assist +1 and pt able to thread B UEs into shirt. Pt becoming SOB while on RA with supplemental O2 removed while shirt going overhead, able to recover quickly once O2 administered again. Pt left tilted in tilt-in-space w/c at end of session with hand off to SLP.   Therapy Documentation Precautions:  Precautions Precautions: Fall, Other (comment) Precaution Comments: trach, PEG, T5 para Restrictions Weight Bearing Restrictions: No ADL: ADL ADL Comments: see functional navigator  See Function Navigator for Current Functional  Status.   Therapy/Group: Individual Therapy  [name], Terryl Niziolek C 06/28/2016, 7:05 AM
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
## 2016-06-28 NOTE — Patient Care Conference (Signed)
Inpatient RehabilitationTeam Conference and Plan of Care Update Date: 06/28/2016   Time: 9:30 AM    Patient Name: Samuel Peck      Medical Record Number: [id number]  Date of Birth: 28-Feb-1941 Sex: Male         Room/Bed: 4W13C/4W13C-01 Payor Info: Payor: BLUE CROSS BLUE SHIELD / Plan: BCBS OTHER / Product Type: *No Product type* /    Admitting Diagnosis: no diagnosis  Admit Date/Time:  06/24/2016  3:16 PM Admission Comments: No comment available   Primary Diagnosis:  T5 spinal cord injury (HCC) Principal Problem: T5 spinal cord injury [facility](HCC)  Patient Active Problem List   Diagnosis Date Noted  . Acute blood loss anemia   . Leukocytosis   . Diabetes mellitus type 2 in nonobese (HCC)   . PAF (paroxysmal atrial fibrillation) (HCC)   . T5 spinal cord injury (HCC) 06/24/2016  . Paraplegia (HCC) 06/24/2016  . Tracheostomy status (HCC) 06/24/2016  . Sacral decubitus ulcer, stage II 06/24/2016  . Bacterial UTI 06/24/2016  . Chronic atrial fibrillation (HCC) 06/24/2016  . 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 Pleural effusion, left   . Pneumothorax   . Encounter for nasogastric (NG) tube placement   . Admission for chest tube placement   . Acute respiratory failure [facility](HCC)     Expected Discharge Date: Expected Discharge Date: 07/22/16  Team Members Present: Physician leading conference: Dr. Maryla MorrowAnkit Patel Social Worker Present: Staci AcostaJenny Carsen Machi, LCSW Nurse Present: Ronny BaconWhitney Reardon, RN PT Present: Alyson ReedyElizabeth Tygielski, PT OT Present: Johnsie CancelAmy [name], OT SLP Present: Feliberto Gottronourtney Payne, SLP PPS Coordinator present : Tora DuckMarie Noel, RN, CRRN     Current Status/Progress Goal Weekly Team Focus  Medical   Functional and mobility deficits secondary to T5 fracture/SCI with paraplegia. Multiple post- injury complications  Improve multiple medical issues, mobility, transfers  See above   Bowel/Bladder   foley in place,  LBM 06-27-16 loose stool-contact C-diff  Continent of bowel and bladder   evaluate need for foley daily, monitor stool     Swallow/Nutrition/ Hydration   NPO with PEG  Min A with least restrictive diet  Pharyngeal strengtening exercises, breath support   ADL's   +2 bed mobility; Maximove functional transfers; total A +2 LB bathing/dressing; Mod A UB dressing  Mod-max overall  OOB tolerance; sitting balance; education; ADL re-training   Mobility             Communication   #6 Cuffless trach with PMSV with full supervision, Max A for use of verbal expression instead of gestures and for overall speech intelligibility   Supervision  breath support, increased verbal expression   Safety/Cognition/ Behavioral Observations  Min A  Supervision   recall with use of strategies    Pain   no complaints of pain  no c/o of pain  Monitor pain q shift and prn   Skin   MASD to bottom woc following allevyn, MASD groin nystain cream in use  no new skin breakdown or infection   monitor skin q shift and prn    Rehab Goals Patient on target to meet rehab goals: Yes Rehab Goals Revised: none as this is pt's first conference *See Care Plan and progress notes for long and short-term goals.  Barriers to Discharge: Mobility, safety, transfers, PNA, neurogenic bowel/bladder, PAF    Possible Resolutions to Barriers:  Therapies, pt and family edu, pulm consult, bowel/bladder program when appropriate    Discharge Planning/Teaching Needs:  [name] plans to care for pt at home  at d/c.  Wife is here daily until about lunchtime.  She wants to be a part of his rehabilitation.   Team Discussion:  Dr. Allena KatzPatel plans to discontinue the foley today and see how pt does.  May need in and out caths.  Pt with loose stools/diarrhea, but is doing better.  Pt was negative for c-diff.  Pt with chest x-ray yesterday and he has increased infiltrates.  Pulmonary is involved and Dr. Allena KatzPatel thinks they may increase antibiotics, so concerned that BMs may get worse.  Speech Therapist feels pt sounds wet.  RN  monitoring skin on pt's bottom.  PT and OT are using maxi-move and pt is currently total +2 .  Did well with sitting balance at supervision for a few seconds and can follow commands, but is mostly max assist.  Revisions to Treatment Plan:  none   Continued Need for Acute Rehabilitation Level of Care: The patient requires daily medical management by a physician with specialized training in physical medicine and rehabilitation for the following conditions: Daily direction of a multidisciplinary physical rehabilitation program to ensure safe treatment while eliciting the highest outcome that is of practical value to the patient.: Yes Daily medical management of patient stability for increased activity during participation in an intensive rehabilitation regime.: Yes Daily analysis of laboratory values and/or radiology reports with any subsequent need for medication adjustment of medical intervention for : Post surgical problems;Pulmonary problems;Neurological problems;Wound care problems;Urological problems  Hensley Aziz, [name] 06/28/2016, 2:33 PM

## 2016-06-28 NOTE — Progress Notes (Signed)
Speech Language Pathology Daily Make-Up Session Note  Patient Details  Name: Samuel LeventhalCharles Channing MRN: [id number] Date of Birth: Nov 30, 1940  Today's Date: 06/28/2016 SLP Individual Time: 0830-0900 SLP Individual Time Calculation (min): 30 min   Short Term Goals: Week 1: SLP Short Term Goal 1 (Week 1): Pt will wear PMSV for 60 minutes without changes in vital signs or complaints of discomfort over 2 consecutive sessions.   SLP Short Term Goal 2 (Week 1): Pt will consume therapeutic trials of ice chips without overt s/s of aspiration and min assist for use of swallowing precautions over 3 consecutive sessions prior to repeat objective assessment.  SLP Short Term Goal 3 (Week 1): Pt will orally expectorate secretions in 50% of opportunities with min assist verbal cues.   SLP Short Term Goal 4 (Week 1): Pt will utilize increased vocal intensity and overarticulation to achieve intelligibility at the phrase level with min assist verbal cues.   SLP Short Term Goal 5 (Week 1): Pt will utilize external aids to facilitate recall of daily information with min assist verbal cues.    Skilled Therapeutic Interventions: Skilled treatment session focused on speech and dysphagia goals. Upon arrival, patient was sitting upright in the wheelchair and appeared lethargic. Patient's PMSV was in place and all vitals remained [facility] evaluation. Patient required Mod A verbal cues for verbal initiation instead of use of gestures and for use of an increased vocal intensity to maximize intelligibility to ~75% at the phrase level. Patient's speech is impacted by his deconditioning, lethargy and severely impaired breath support. Patient with a baseline wet vocal quality today with inability to expectorate secretions despite multiple attempts due to poor breath support. RN made aware and RT present to provide deep suctioning and a breathing treatment. Patient also performed oral care via the suction toothbrush with set-up  assist and Min A verbal cues. SLP provided additional oral care for thoroughness. Patient's wife present and educated on patient's current swallowing function and this clinician reviewed most recent MBS with her. She verbalized understanding of all information. Patient left upright in wheelchair with RN present. Continue with current plan of care.   Function:  Eating Eating Eating activity did not occur: Safety/medical concerns               Cognition Comprehension Comprehension assist level: Understands basic 90% of the time/cues < 10% of the time  Expression   Expression assist level: Expresses basic 50 - 74% of the time/requires cueing 25 - 49% of the time. Needs to repeat parts of sentences.  Social Interaction Social Interaction assist level: Interacts appropriately 90% of the time - Needs monitoring or encouragement for participation or interaction.  Problem Solving Problem solving assist level: Solves basic 50 - 74% of the time/requires cueing 25 - 49% of the time  Memory Memory assist level: Recognizes or recalls 75 - 89% of the time/requires cueing 10 - 24% of the time    Pain No/Denies Pain   Therapy/Group: Individual Therapy  Breck [place] 06/28/2016, 9:35 AM
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
## 2016-06-28 NOTE — Progress Notes (Signed)
Social Work Patient ID: Samuel LeventhalCharles Peck, male   DOB: May 16, 1941, 75 y.o.   MRN: [id number]   Nigel SloopJennifer C Pegge Cumberledge, LCSW Social Worker Signed   Patient Care Conference Date of Service: 06/28/2016  2:33 PM      Hide copied text Hover for attribution information Inpatient RehabilitationTeam Conference and Plan of Care Update Date: 06/28/2016   Time: 9:30 AM      Patient Name: Samuel LeventhalCharles Peck      Medical Record Number: [id number]  Date of Birth: May 16, 1941 Sex: Male         Room/Bed: 4W13C/4W13C-01 Payor Info: Payor: BLUE CROSS BLUE SHIELD / Plan: BCBS OTHER / Product Type: *No Product type* /     Admitting Diagnosis: no diagnosis  Admit Date/Time:  06/24/2016  3:16 PM Admission Comments: No comment available    Primary Diagnosis:  T5 spinal cord injury (HCC) Principal Problem: T5 spinal cord injury [facility](HCC)       Patient Active Problem List    Diagnosis Date Noted  . Acute blood loss anemia    . Leukocytosis    . Diabetes mellitus type 2 in nonobese (HCC)    . PAF (paroxysmal atrial fibrillation) (HCC)    . T5 spinal cord injury (HCC) 06/24/2016  . Paraplegia (HCC) 06/24/2016  . Tracheostomy status (HCC) 06/24/2016  . Sacral decubitus ulcer, stage II 06/24/2016  . Bacterial UTI 06/24/2016  . Chronic atrial fibrillation (HCC) 06/24/2016  . 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 Pleural effusion, left    . Pneumothorax    . Encounter for nasogastric (NG) tube placement    . Admission for chest tube placement    . Acute respiratory failure [facility](HCC)        Expected Discharge Date: Expected Discharge Date: 07/22/16   Team Members Present: Physician leading conference: Dr. Maryla MorrowAnkit Patel Social Worker Present: Staci AcostaJenny Yareth Kearse, LCSW Nurse Present: Ronny BaconWhitney Reardon, RN PT Present: Alyson ReedyElizabeth Tygielski, PT OT Present: Johnsie CancelAmy [name], OT SLP Present: Feliberto Gottronourtney Payne, SLP PPS Coordinator present : Tora DuckMarie Noel, RN, CRRN       Current Status/Progress Goal Weekly Team Focus  Medical   Functional  and mobility deficits secondary to T5 fracture/SCI with paraplegia. Multiple post- injury complications  Improve multiple medical issues, mobility, transfers  See above   Bowel/Bladder   foley in place,  LBM 06-27-16 loose stool-contact C-diff  Continent of bowel and bladder  evaluate need for foley daily, monitor stool     Swallow/Nutrition/ Hydration   NPO with PEG  Min A with least restrictive diet  Pharyngeal strengtening exercises, breath support   ADL's   +2 bed mobility; Maximove functional transfers; total A +2 LB bathing/dressing; Mod A UB dressing  Mod-max overall  OOB tolerance; sitting balance; education; ADL re-training   Mobility             Communication   #6 Cuffless trach with PMSV with full supervision, Max A for use of verbal expression instead of gestures and for overall speech intelligibility   Supervision  breath support, increased verbal expression   Safety/Cognition/ Behavioral Observations Min A  Supervision   recall with use of strategies    Pain   no complaints of pain  no c/o of pain  Monitor pain q shift and prn   Skin   MASD to bottom woc following allevyn, MASD groin nystain cream in use  no new skin breakdown or infection   monitor skin q shift and prn     Rehab Goals Patient  on target to meet rehab goals: Yes Rehab Goals Revised: none as this is pt's first conference *See Care Plan and progress notes for long and short-term goals.   Barriers to Discharge: Mobility, safety, transfers, PNA, neurogenic bowel/bladder, PAF   Possible Resolutions to Barriers:  Therapies, pt and family edu, pulm consult, bowel/bladder program when appropriate   Discharge Planning/Teaching Needs:  [name] plans to care for pt at home at d/c.  Wife is here daily until about lunchtime.  She wants to be a part of his rehabilitation.   Team Discussion:  Dr. Allena KatzPatel plans to discontinue the foley today and see how pt does.  May need in and out caths.  Pt with loose stools/diarrhea,  but is doing better.  Pt was negative for c-diff.  Pt with chest x-ray yesterday and he has increased infiltrates.  Pulmonary is involved and Dr. Allena KatzPatel thinks they may increase antibiotics, so concerned that BMs may get worse.  Speech Therapist feels pt sounds wet.  RN monitoring skin on pt's bottom.  PT and OT are using maxi-move and pt is currently total +2 .  Did well with sitting balance at supervision for a few seconds and can follow commands, but is mostly max assist.  Revisions to Treatment Plan:  none    Continued Need for Acute Rehabilitation Level of Care: The patient requires daily medical management by a physician with specialized training in physical medicine and rehabilitation for the following conditions: Daily direction of a multidisciplinary physical rehabilitation program to ensure safe treatment while eliciting the highest outcome that is of practical value to the patient.: Yes Daily medical management of patient stability for increased activity during participation in an intensive rehabilitation regime.: Yes Daily analysis of laboratory values and/or radiology reports with any subsequent need for medication adjustment of medical intervention for : Post surgical problems;Pulmonary problems;Neurological problems;Wound care problems;Urological problems   Dajon Lazar, [name] 06/28/2016, 2:33 PM

## 2016-06-28 NOTE — Progress Notes (Signed)
Inpatient Rehabilitation Center Individual Statement of Services  Patient Name:  Erasmo LeventhalCharles Grieser  Date:  06/28/2016  Welcome to the Inpatient Rehabilitation Center.  Our goal is to provide you with an individualized program based on your diagnosis and situation, designed to meet your specific needs.  With this comprehensive rehabilitation program, you will be expected to participate in at least 3 hours of rehabilitation therapies Monday-Friday, with modified therapy programming on the weekends.  Your rehabilitation program will include the following services:  Physical Therapy (PT), Occupational Therapy (OT), Speech Therapy (ST), 24 hour per day rehabilitation nursing, Case Management (Social Worker), Rehabilitation Medicine, Nutrition Services and Pharmacy Services  Weekly team conferences will be held on Tuesdays to discuss your progress.  Your Social Worker will talk with you frequently to get your input and to update you on team discussions.  Team conferences with you and your family in attendance may also be held.  Expected length of stay: 3 to 4 weeks  Overall anticipated outcome: moderate to maximum assistance with hoyer lift  Depending on your progress and recovery, your program may change. Your Social Worker will coordinate services and will keep you informed of any changes. Your Social Worker's name and contact numbers are listed  below.  The following services may also be recommended but are not provided by the Inpatient Rehabilitation Center:   Driving Evaluations  Home Health Rehabiltiation Services  Outpatient Rehabilitation Services   Arrangements will be made to provide these services after discharge if needed.  Arrangements include referral to agencies that provide these services.  Your insurance has been verified to be:  Medicare and [employer] Shield Your primary doctor is:  Dr. Valetta CloseMai Trang Nguyen  Pertinent information will be shared with your doctor and your  insurance company.  Social Worker:  Staci AcostaJenny Beaux Verne, LCSW  [phone] or [phone]) [phone]  Information discussed with and copy given to patient by: Elvera LennoxPrevatt, Hong Moring Capps, 06/28/2016, 1:49 PM

## 2016-06-28 NOTE — Progress Notes (Signed)
Speech Language Pathology Daily Session Note  Patient Details  Name: Samuel LeventhalCharles Peck MRN: [id number] Date of Birth: Oct 11, 1940  Today's Date: 06/28/2016 SLP Individual Time: 1335-1425 SLP Individual Time Calculation (min): 50 min   Short Term Goals: Week 1: SLP Short Term Goal 1 (Week 1): Pt will wear PMSV for 60 minutes without changes in vital signs or complaints of discomfort over 2 consecutive sessions.   SLP Short Term Goal 2 (Week 1): Pt will consume therapeutic trials of ice chips without overt s/s of aspiration and min assist for use of swallowing precautions over 3 consecutive sessions prior to repeat objective assessment.  SLP Short Term Goal 3 (Week 1): Pt will orally expectorate secretions in 50% of opportunities with min assist verbal cues.   SLP Short Term Goal 4 (Week 1): Pt will utilize increased vocal intensity and overarticulation to achieve intelligibility at the phrase level with min assist verbal cues.   SLP Short Term Goal 5 (Week 1): Pt will utilize external aids to facilitate recall of daily information with min assist verbal cues.    Skilled Therapeutic Interventions:  Pt was seen for skilled ST targeting communication goals.  Pt laying in bed upon arrival awake, lethargic but agreeable to participating in bedside therapies.  SLP completed oral care with suction toothette.  Vocal quality was notably wet so PMV was placed for brief periods to attempt to facilitate oral expectoration of secretions.  Pt unable to clear secretions orally and was only able to clear a small amount of thin, white mucus from trach hub without improvement in vocal quality.  SLP even utilized manual abdominal bracing to elicit improved clearance of secretions without effect. Pt noted with increased work of breathing with brief periods of valve use with x1 report of discomfort and request for removal.  With valve in place pt was overall min assist for use of increased vocal intensity to achieve  intelligibility at the phrase-sentence level.  Without valve in place pt needed max assist to achieve intelligibility at the word-short phrase level.  Pt continuously attempting to clear secretions from trach hub resulting in fatigue towards the end of today's therapy session.  Requested deep suctioning from RN.  Session ended early due to pt fatigue.  Pt left in bed with call bell within reach.  Continue per current plan of care.       Function:  Eating Eating                 Cognition Comprehension Comprehension assist level: Understands basic 90% of the time/cues < 10% of the time  Expression Expression assistive device: Talk trach valve Expression assist level: Expresses basic 50 - 74% of the time/requires cueing 25 - 49% of the time. Needs to repeat parts of sentences.  Social Interaction Social Interaction assist level: Interacts appropriately 90% of the time - Needs monitoring or encouragement for participation or interaction.  Problem Solving Problem solving assist level: Solves basic 50 - 74% of the time/requires cueing 25 - 49% of the time  Memory Memory assist level: Recognizes or recalls 75 - 89% of the time/requires cueing 10 - 24% of the time    Pain Pain Assessment Pain Assessment: No/denies pain  Therapy/Group: Individual Therapy  Delsa Walder, Melanee SpryNicole L 06/28/2016, 4:04 PM

## 2016-06-29 ENCOUNTER — Inpatient Hospital Stay (HOSPITAL_COMMUNITY): Payer: Medicare Other | Admitting: Physical Therapy

## 2016-06-29 ENCOUNTER — Inpatient Hospital Stay (HOSPITAL_COMMUNITY): Payer: Medicare Other | Admitting: *Deleted

## 2016-06-29 ENCOUNTER — Inpatient Hospital Stay (HOSPITAL_COMMUNITY): Payer: No Typology Code available for payment source | Admitting: Speech Pathology

## 2016-06-29 ENCOUNTER — Inpatient Hospital Stay (HOSPITAL_COMMUNITY): Payer: No Typology Code available for payment source

## 2016-06-29 ENCOUNTER — Other Ambulatory Visit (HOSPITAL_COMMUNITY): Payer: Medicare Other

## 2016-06-29 ENCOUNTER — Inpatient Hospital Stay (HOSPITAL_COMMUNITY): Payer: No Typology Code available for payment source | Admitting: Occupational Therapy

## 2016-06-29 DIAGNOSIS — E46 Unspecified protein-calorie malnutrition: Secondary | ICD-10-CM

## 2016-06-29 DIAGNOSIS — E8809 Other disorders of plasma-protein metabolism, not elsewhere classified: Secondary | ICD-10-CM

## 2016-06-29 DIAGNOSIS — J17 Pneumonia in diseases classified elsewhere: Secondary | ICD-10-CM

## 2016-06-29 DIAGNOSIS — I4891 Unspecified atrial fibrillation: Secondary | ICD-10-CM

## 2016-06-29 DIAGNOSIS — N319 Neuromuscular dysfunction of bladder, unspecified: Secondary | ICD-10-CM

## 2016-06-29 DIAGNOSIS — B999 Unspecified infectious disease: Secondary | ICD-10-CM

## 2016-06-29 DIAGNOSIS — J189 Pneumonia, unspecified organism: Secondary | ICD-10-CM

## 2016-06-29 DIAGNOSIS — J90 Pleural effusion, not elsewhere classified: Secondary | ICD-10-CM

## 2016-06-29 LAB — CBC WITH DIFFERENTIAL/PLATELET
BASOS ABS: 0 10*3/uL (ref 0.0–0.1)
Basophils Relative: 0 %
EOS PCT: 4 %
Eosinophils Absolute: 0.4 10*3/uL (ref 0.0–0.7)
HEMATOCRIT: 37.1 % — AB (ref 39.0–52.0)
Hemoglobin: 11 g/dL — ABNORMAL LOW (ref 13.0–17.0)
LYMPHS ABS: 1.4 10*3/uL (ref 0.7–4.0)
LYMPHS PCT: 14 %
MCH: 26.4 pg (ref 26.0–34.0)
MCHC: 29.6 g/dL — AB (ref 30.0–36.0)
MCV: 89 fL (ref 78.0–100.0)
MONOS PCT: 3 %
Monocytes Absolute: 0.3 10*3/uL (ref 0.1–1.0)
NEUTROS ABS: 8.1 10*3/uL — AB (ref 1.7–7.7)
Neutrophils Relative %: 79 %
Platelets: 272 10*3/uL (ref 150–400)
RBC: 4.17 MIL/uL — ABNORMAL LOW (ref 4.22–5.81)
RDW: 18 % — ABNORMAL HIGH (ref 11.5–15.5)
WBC: 10.2 10*3/uL (ref 4.0–10.5)

## 2016-06-29 LAB — BASIC METABOLIC PANEL
Anion gap: 11 (ref 5–15)
BUN: 23 mg/dL — AB (ref 6–20)
CHLORIDE: 107 mmol/L (ref 101–111)
CO2: 21 mmol/L — AB (ref 22–32)
CREATININE: 0.32 mg/dL — AB (ref 0.61–1.24)
Calcium: 8.3 mg/dL — ABNORMAL LOW (ref 8.9–10.3)
GFR calc non Af Amer: >60 mL/min (ref >60)
Glucose, Bld: 175 mg/dL — ABNORMAL HIGH (ref 65–99)
Potassium: 4.6 mmol/L (ref 3.5–5.1)
SODIUM: 139 mmol/L (ref 135–145)

## 2016-06-29 LAB — GLUCOSE, CAPILLARY
GLUCOSE-CAPILLARY: 161 mg/dL — AB (ref 65–99)
GLUCOSE-CAPILLARY: 161 mg/dL — AB (ref 65–99)
GLUCOSE-CAPILLARY: 176 mg/dL — AB (ref 65–99)
Glucose-Capillary: 113 mg/dL — ABNORMAL HIGH (ref 65–99)
Glucose-Capillary: 163 mg/dL — ABNORMAL HIGH (ref 65–99)
Glucose-Capillary: 156 mg/dL — ABNORMAL HIGH (ref 65–99)

## 2016-06-29 LAB — ALBUMIN: ALBUMIN: 1.9 g/dL — AB (ref 3.5–5.0)

## 2016-06-29 LAB — LACTATE DEHYDROGENASE: LDH: 152 U/L (ref 98–192)

## 2016-06-29 MED ORDER — IPRATROPIUM-ALBUTEROL 0.5-2.5 (3) MG/3ML IN SOLN
3.0000 mL | Freq: Two times a day (BID) | RESPIRATORY_TRACT | Status: DC
  Administered 2016-06-29 – 2016-07-05 (×13): 3 mL via RESPIRATORY_TRACT
  Filled 2016-06-29 (×15): qty 3

## 2016-06-29 NOTE — Progress Notes (Addendum)
PHYSICAL MEDICINE & REHABILITATION     PROGRESS NOTE  Subjective/Complaints:  Pt seen laying in bed this AM.  He slept well overnight.  He remains flat.   ROS: Denies CP, SOB, N/V/D.  Objective: Vital Signs: Blood pressure (!) 114/55, pulse 79, temperature 98.4 F (36.9 C), temperature source Oral, resp. rate 18, height 6\' 2"  (1.88 m), weight 83.6 kg (184 lb 4.8 oz), SpO2 100 %. Dg Chest 2 View  Result Date: 06/27/2016 CLINICAL DATA:  SOB, with trach tube. patient could not explain complaints. Patient's mattress prevented patient from leaning forward for lateral view. EXAM: CHEST - 2 VIEW COMPARISON:  06/20/2016 FINDINGS: Progressive consolidation in the left lower lung. New patchy airspace disease in the left mid lung. Stable airspace disease at the right lung base. Heart size and mediastinal contours are within normal limits. Can't exclude small left pleural effusion. Upper thoracic fixation hardware as before. IVC filter partially visualized. IMPRESSION: 1. Worsening airspace disease in the left mid and lower lung with possible left effusion. Electronically Signed   By: Corlis Leak  Hassell M.D.   On: 06/27/2016 19:53   Dg Chest Port 1 View  Result Date: 06/29/2016 CLINICAL DATA:  Follow-up pleural effusion. Tracheostomy patient. 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 EXAM: PORTABLE CHEST 1 VIEW COMPARISON:  Chest x-ray of June 27, 2016 FINDINGS: The lungs are adequately inflated. The interstitial markings are increased bilaterally but not greatly changed. The left hemidiaphragm remains obscured. The cardiac silhouette is top-normal in size. The pulmonary vascularity is normal. There is calcification in the wall of the aortic arch. The tracheostomy appliance tip lies between the clavicular heads. The patient has undergone posterior fusion from T2 through T8. IMPRESSION: Stable appearance of the increased interstitial markings bilaterally consistent  with pneumonia. Electronically Signed   By: David  SwazilandJordan M.D.   On: 06/29/2016 08:43    Recent Labs  06/27/16 0430  WBC 11.1*  HGB 10.3*  HCT 34.9*  PLT 267    Recent Labs  06/27/16 0430  NA 140  K 4.4  CL 105  GLUCOSE 204*  BUN 20  CREATININE 0.35*  CALCIUM 8.1*   CBG (last 3)   Recent Labs  06/28/16 2351 06/29/16 0356 06/29/16 0810  GLUCAP 172* 113* 163*    Wt Readings from Last 3 Encounters:  06/28/16 83.6 kg (184 lb 4.8 oz)    Physical Exam:  BP (!) 114/55 (BP Location: Left Arm)   Pulse 79   Temp 98.4 F (36.9 C) (Oral)   Resp 18   Ht 6\' 2"  (1.88 m)   Wt 83.6 kg (184 lb 4.8 oz)   SpO2 100%   BMI 23.66 kg/m  Constitutional: No distress. Vital signs reviewed.  HENT: Normocephalic. Atraumatic Eyes: EOMI. No discharge.  Neck: +trach.  Cardiovascular: RRR. No JVD. Respiratory: Effort normal. He has no wheezes. +Upper airway sounds.  GI: Soft. Bowel sounds are normal.  Musculoskeletal: He exhibits no edema, no tenderness.  Neurological: Alert. Motor: B/l UE 4+/5 proximal to distal. B/l LE: 0/5 (stable) Sensation absent to light touch b/l LE No increase in tone noted (unchanged).  Skin:  Stage II sacral decubitus, not examined Small DTI on 1st digit LLE, not examined today Psychiatric:  Flat but cooperative  Assessment/Plan: 1. Functional deficits secondary to T5 fracture/SCI with paraplegia which require 3+ hours per day of interdisciplinary therapy in a comprehensive inpatient rehab setting. Physiatrist is providing close team supervision and 24 hour  management of active medical problems listed below. Physiatrist and rehab team continue to assess barriers to discharge/monitor patient progress toward functional and medical goals.  Function:  Bathing Bathing position   Position: Wheelchair/chair at sink (Pt on night bath; total A +2 for full body)  Bathing parts Body parts bathed by patient: Right arm, Left arm, Chest, Abdomen Body parts  bathed by helper: Back  Bathing assist Assist Level: Touching or steadying assistance(Pt > 75%)      Upper Body Dressing/Undressing Upper body dressing   What is the patient wearing?: Pull over shirt/dress     Pull over shirt/dress - Perfomed by patient: Thread/unthread right sleeve, Thread/unthread left sleeve, Put head through opening Pull over shirt/dress - Perfomed by helper: Pull shirt over trunk        Upper body assist Assist Level: 2 helpers      Lower Body Dressing/Undressing Lower body dressing   What is the patient wearing?: Pants, Socks, Shoes       Pants- Performed by helper: Thread/unthread right pants leg, Thread/unthread left pants leg, Pull pants up/down, Fasten/unfasten pants       Socks - Performed by helper: Don/doff right sock, Don/doff left sock   Shoes - Performed by helper: Don/doff right shoe, Don/doff left shoe, Fasten right, Fasten left          Lower body assist Assist for lower body dressing: 2 Designer, multimedia activity did not occur: Safety/medical concerns        Toileting assist Assist level: Two helpers (per NT report)   Transfers Chair/bed transfer   Chair/bed transfer method: Other Chair/bed transfer assist level: 2 helpers Chair/bed transfer assistive device: Systems developer lift: Maximove   Locomotion Ambulation Ambulation activity did not occur: Safety/medical concerns         Wheelchair   Type: Motorized Max wheelchair distance: 150 Assist Level: Total assistance (Pt < 25%)  Cognition Comprehension Comprehension assist level: Understands basic 90% of the time/cues < 10% of the time  Expression Expression assist level: Expresses basic 50 - 74% of the time/requires cueing 25 - 49% of the time. Needs to repeat parts of sentences.  Social Interaction Social Interaction assist level: Interacts appropriately 90% of the time - Needs monitoring or encouragement for participation or  interaction.  Problem Solving Problem solving assist level: Solves basic 50 - 74% of the time/requires cueing 25 - 49% of the time  Memory Memory assist level: Recognizes or recalls 75 - 89% of the time/requires cueing 10 - 24% of the time     Medical Problem List and Plan: 1. Functional and mobility deficitssecondary to T5 fracture/SCI with paraplegia. Multiple post- injury complications  Cont CIR  RLE PRAFO ordered 2. DVT Prophylaxis/Anticoagulation: Pharmaceutical:   Discussed with [facility] surgery, Eliquis resumed 3. Pain Management: tylenol, tramadol for more severe pain 4. Mood: team to provide ego support 5. Neuropsych: This patient is capable of making decisions on hisown behalf. 6. Skin/Wound Care: Air mattress overlay. Eucerin cream to bilateral feet. Maintain adequate nutritional and hydration status.   7. Fluids/Electrolytes/Nutrition:   Strict NPO at this time.   Consult dietician to assist with tube feeds.   Changed to more elemental form to see if this helps with ongoing diarrhea.   D/Ced IVF and increase water flushes. 8. Sacral decub Stage II: Has has a lot of muscle loss--added protein supplement to tube feeds. Added additional vitamins to help promote healing. WOC  9. Pseudomonas  Aeruginosa UTI with Urosepsis: Has been afebrile on antibiotics.   Continue meropenum per Pulm   Foley changed, as has been in 30 days.  10. Acute renal failure: Resolved.   Cr within acceptable limits 12/18 11. VDRF with tracheostomy: Case discussed with PCCM NP--to continue CFS # 6 cough/endurance improves.   Ordered chest vest as well as hypertonic saline nebs X 48 hours to help with mobilize mucous/atelectasis.  12. H/o A fib s/p DCCV: Eliquis restarted. Monitor HR qid.  13. Recent C diff colitis: treated. Monitor for recurrence with antibiotics on board again.  14. Dysphagia: Continue NPO status. Speech therapy for PMSV and trials of ice chips as indicated. 15. DM type 2:   Hgb  A1c 6.4  Monitor BS every 4 hours  Lantus 10 BID, increased to 13 on 12/19  Appears to be improving, will cont to make further adjustments as necessary. 16. Protein calorie Malnutrition: Increased rate of TF for until evaluated by RD.  17. Leukocytosis  WBCs 11.1 on 12/18  Cont to monitor 18. ABLA  Hb 10.3 on 12/18  Cont to monitor 19. PNA  CXR 12/18 reviewed with increased infiltrates, effusion  Appreciate Pulm recs, cont Meropenum  Plan for VIR intervention 20. Neurogenic bladder  Scheduled I/O cath 21. Hypoalbuminemia  Improving 1.9 on 12/20   Cont supplementation    LOS (Days) 5 A FACE TO FACE EVALUATION WAS PERFORMED  Samuel Peck Samuel Peck Samuel Peck 06/29/2016 10:57 AM

## 2016-06-29 NOTE — Progress Notes (Signed)
Infection Prevention note : Patient may have short personal pet visit. Please encourage hand hygiene, clean clothes, pre and post cleaning of high touch surfaces with bleach product and pet should not be allowed on the patients bed.  Fredric DineMargaret Ewald Beg RN BSN Infection Prevention

## 2016-06-29 NOTE — Progress Notes (Signed)
Speech Language Pathology Daily Session Note  Patient Details  Name: Samuel LeventhalCharles Peck MRN: [id number] Date of Birth: 1940/12/04  Today's Date: 06/29/2016 SLP Individual Time: 1131-1202 SLP Individual Time Calculation (min): 31 min   Short Term Goals: Week 1: SLP Short Term Goal 1 (Week 1): Pt will wear PMSV for 60 minutes without changes in vital signs or complaints of discomfort over 2 consecutive sessions.   SLP Short Term Goal 2 (Week 1): Pt will consume therapeutic trials of ice chips without overt s/s of aspiration and min assist for use of swallowing precautions over 3 consecutive sessions prior to repeat objective assessment.  SLP Short Term Goal 3 (Week 1): Pt will orally expectorate secretions in 50% of opportunities with min assist verbal cues.   SLP Short Term Goal 4 (Week 1): Pt will utilize increased vocal intensity and overarticulation to achieve intelligibility at the phrase level with min assist verbal cues.   SLP Short Term Goal 5 (Week 1): Pt will utilize external aids to facilitate recall of daily information with min assist verbal cues.    Skilled Therapeutic Interventions: Skilled treatment session focused on speech and dysphagia goals. Upon arrival, patient was supine in bed with PMSV in place and RN present. Patient with intermittent desaturation requiring deep suctioning per RN and removal of PMSV per patient request. Patient with intermittent ability to talk around the trach and was ~75% intelligible at the phrase level. Patient and his wife educated in regards to 2 pharyngeal strengthening exercises that focuses on base of tongue strength, however, patient unable to perform accurately due to fatigue. Patient with audible upper airway secretions that he was unable to clear despite Max A multimodal cues and attempts. SLP also spent a more than reasonable amount of time providing oral care due to dried, thick secretions throughout the patient's palate and dentition. Patient left  supine in bed with wife present. Continue with current plan of care.   Function:  Cognition Comprehension Comprehension assist level: Understands basic 90% of the time/cues < 10% of the time  Expression Expression assistive device: Talk trach valve Expression assist level: Expresses basic 50 - 74% of the time/requires cueing 25 - 49% of the time. Needs to repeat parts of sentences.  Social Interaction Social Interaction assist level: Interacts appropriately 90% of the time - Needs monitoring or encouragement for participation or interaction.  Problem Solving Problem solving assist level: Solves basic 50 - 74% of the time/requires cueing 25 - 49% of the time  Memory Memory assist level: Recognizes or recalls 75 - 89% of the time/requires cueing 10 - 24% of the time    Pain No/Denies Pain   Therapy/Group: Individual Therapy  Kalee Broxton 06/29/2016, 3:47 PM

## 2016-06-29 NOTE — Progress Notes (Signed)
Recreational Therapy Session Note  Patient Details  Name: Samuel Peck MRN: [id number] Date of Birth: 1941-03-22 Today's Date: 06/29/2016  Pain: no c/o Skilled Therapeutic Interventions/Progress Updates: Met with pt and wife to discuss TR services, pt placed on HOLD at this time due to low activity tolerance. Discussed personal pet visitation and current hospital policy which restricts pt's dog from visiting due to isolation precautions.   Contacted Margaret, RN from Infection Control to discuss a case review to allow pt's dog to visit- see Infection Control-Margaret, RN's note for details on allowing the dog to visit. Will continue to monitor through team for future TR participation.  [place] 06/29/2016, 4:10 PM

## 2016-06-29 NOTE — Progress Notes (Signed)
Occupational Therapy Session Note  Patient Details  Name: Samuel LeventhalCharles Peck MRN: [id number] Date of Birth: Jan 24, 1941  Today's Date: 06/29/2016 OT Individual Time: [id number] and [id number] OT Individual Time Calculation (min): 75 min and 10 min OT Missed Time: 50 min (pt fatigue)   Short Term Goals:Week 1:  OT Short Term Goal 1 (Week 1): Pt will don shirt with supervision in supported chair OT Short Term Goal 2 (Week 1): Pt will tolerate circle sitting with mod A in prep for LB dressing  OT Short Term Goal 3 (Week 1): Pt will transfer via slide board bed<>w/c with max A+2  OT Short Term Goal 4 (Week 1): Pt will maintain static sitting balance on firm surface with mod A in prep for ADL tasks performed in sitting  OT Short Term Goal 5 (Week 1): Pt will perform one grooming tasks at sink with min A  Skilled Therapeutic Interventions/Progress Updates:    Session One: Pt seen for OT session focusing on upright tolerance and ADL re-training at w/c level. Pt in supine upon arrival, denying pain at rest. Pants donned total A in supine with +2 assist to roll. MAximove used to transfer pt to w/c. Pt completed grooming and UB bathing/dressing at sink. Pt able to use UEs to pull self forward to sink, however, required B UE support on sink ledge to maintain balance. Reclined rest breaks required throughout task due to decreased activity tolerance. Pt on 6L supplemental O2 throughout session, would occasionally drop into low 80%s, however, returned to 90%s with VCs for deep breathing technique. HR remained 80-90s during B/D.  He required occasional min A for balance in chair, able to use UEs to return trunk to midline when needed. He donned shirt with assist to pull down in back and assist for orientation of shirt. Pt left tilted in tilt-in-space w/c at end of session. Reviewed boosting program for pressure relief, pt with no recall of prior education. Boosting performed by this therapist prior to end of session.  All needs in reach.   Session Two: Pt in supine upon arrival, having just returned back from off unit procedure. PMSV placed for education/ discussion throughout session with pt tolerating well. Pt voicing increased fatigue being "worn out". Pt worried declining therapy will affect his ability to stay on CIR, however, requesting to cont to rest. Educated regarding CIR, activity progression, SCI in regards to breathing and progression of weaning O2, and progression of PEG tube/ supplemental feeding. Pt very responsive and appreciative of education.  He was left in supine with all needs in reach.   Therapy Documentation Precautions:  Precautions Precautions: Fall, Other (comment) Precaution Comments: trach, PEG, T5 para Restrictions Weight Bearing Restrictions: No ADL: ADL ADL Comments: see functional navigator  See Function Navigator for Current Functional Status.   Therapy/Group: Individual Therapy  [name], [name] 06/29/2016, 7:07 AM

## 2016-06-29 NOTE — Progress Notes (Signed)
Physical Therapy Session Note  Patient Details  Name: Erasmo LeventhalCharles Claytor MRN: [id number] Date of Birth: 01-19-1941  Today's Date: 06/29/2016 PT Individual Time: 1000-1100 PT Individual Time Calculation (min): 60 min    Short Term Goals: Week 1:  PT Short Term Goal 1 (Week 1): Pt will initiate bed mobility training with leg loops, bed rails and max A of one person PT Short Term Goal 2 (Week 1): Pt will tolerate sitting edge of mat x 5 minutes with max A of one person performing dynamic sitting balance activities PT Short Term Goal 3 (Week 1): Pt will perform bed <> w/c transfers with slideboard and max A of one person on level surfaces PT Short Term Goal 4 (Week 1): Pt will tolerate sitting up in tilt in space w/c x 4 hours calling for boosting/pressure relief every 30 min with 50% reminder cues  Skilled Therapeutic Interventions/Progress Updates:   Pt received seated in w/c, denies pain and agreeable to treatment. Pt on 6L O2 via trach collar and remained >95% throughout session, even during activity when pt reporting SOB. Transfer w/c <>mat table with transfer board, +2A and pt assisting with BUEs during transfer. Sitting balance with close S with BUE support upon reaching edge of mat table. Several semi-reclined rest breaks on stability ball due to shortness of breath with activity, with pt progressing to decreased length of time able to sit upright due to fatigue. Pt alternated lifting UEs to challenge balance in sitting, requiring min guard overall and pt able to regain balance with anterior LOBs however requires assist for lateral and posterior LOBs. Pt performed ball hits while reclined, however unable to progress to upright sitting ball hits due to fatigue. Returned to room at end of session, transfer to bed with maximove and totalA +2. Rolling R/L maxA with bedrails to remove sling. Remained semi-reclined at end of session, wife present and all needs in reach.  Therapy  Documentation Precautions:  Precautions Precautions: Fall, Other (comment) Precaution Comments: trach, PEG, T5 para Restrictions Weight Bearing Restrictions: No Vital Signs: Therapy Vitals Pulse Rate: 76 Resp: 18 Patient Position (if appropriate): Lying Oxygen Therapy SpO2: 100 % O2 Device: Tracheostomy Collar O2 Flow Rate (L/min): 6 L/min FiO2 (%): 28 % Pain: Pain Assessment Pain Assessment: No/denies pain   See Function Navigator for Current Functional Status.   Therapy/Group: Individual Therapy  [name] 06/29/2016, 12:56 PM

## 2016-06-30 ENCOUNTER — Inpatient Hospital Stay (HOSPITAL_COMMUNITY): Payer: Medicare Other | Admitting: Occupational Therapy

## 2016-06-30 ENCOUNTER — Encounter (HOSPITAL_COMMUNITY): Payer: Self-pay | Admitting: *Deleted

## 2016-06-30 ENCOUNTER — Inpatient Hospital Stay (HOSPITAL_COMMUNITY): Payer: No Typology Code available for payment source | Admitting: Speech Pathology

## 2016-06-30 ENCOUNTER — Inpatient Hospital Stay (HOSPITAL_COMMUNITY): Payer: Medicare Other | Admitting: Physical Therapy

## 2016-06-30 DIAGNOSIS — D72829 Elevated white blood cell count, unspecified: Secondary | ICD-10-CM

## 2016-06-30 DIAGNOSIS — I480 Paroxysmal atrial fibrillation: Secondary | ICD-10-CM

## 2016-06-30 DIAGNOSIS — G8220 Paraplegia, unspecified: Secondary | ICD-10-CM

## 2016-06-30 LAB — GLUCOSE, CAPILLARY
GLUCOSE-CAPILLARY: 163 mg/dL — AB (ref 65–99)
Glucose-Capillary: 124 mg/dL — ABNORMAL HIGH (ref 65–99)
Glucose-Capillary: 110 mg/dL — ABNORMAL HIGH (ref 65–99)
Glucose-Capillary: 167 mg/dL — ABNORMAL HIGH (ref 65–99)

## 2016-06-30 LAB — PROCALCITONIN: Procalcitonin: <0.1 ng/mL

## 2016-06-30 MED ORDER — APIXABAN 5 MG PO TABS
5.0000 mg | ORAL_TABLET | Freq: Two times a day (BID) | ORAL | Status: DC
  Administered 2016-06-30 – 2016-07-08 (×17): 5 mg via ORAL
  Filled 2016-06-30 (×17): qty 1

## 2016-06-30 MED ORDER — FUROSEMIDE 10 MG/ML IJ SOLN
40.0000 mg | Freq: Once | INTRAMUSCULAR | Status: AC
  Administered 2016-06-30: 40 mg via INTRAVENOUS
  Filled 2016-06-30: qty 4

## 2016-06-30 NOTE — Progress Notes (Signed)
Physical Therapy Session Note  Patient Details  Name: Samuel Peck MRN: [id number] Date of Birth: 09/02/1940  Today's Date: 06/30/2016 PT Individual Time: 1000-1115 PT Individual Time Calculation (min): 75 min  and Today's Date: 06/30/2016 PT Co-Treatment Time: 1430-1500 PT Co-Treatment Time Calculation (min): 30 min    Short Term Goals: Week 1:  PT Short Term Goal 1 (Week 1): Pt will initiate bed mobility training with leg loops, bed rails and max A of one person PT Short Term Goal 2 (Week 1): Pt will tolerate sitting edge of mat x 5 minutes with max A of one person performing dynamic sitting balance activities PT Short Term Goal 3 (Week 1): Pt will perform bed <> w/c transfers with slideboard and max A of one person on level surfaces PT Short Term Goal 4 (Week 1): Pt will tolerate sitting up in tilt in space w/c x 4 hours calling for boosting/pressure relief every 30 min with 50% reminder cues  Skilled Therapeutic Interventions/Progress Updates:   Tx 1: Chest Physical Therapy Evaluation/Treatment performed today as follows:  MEDICAL DIAGNOSIS: T5 Complete SCI CHEST PT DIAGNOSIS: Poor secretion management and mobilization  PRECAUTIONS: healing rib fractures, cleared by PA  MD ORDERS: Chest PT with percussion, Intubation/Trach/Vent: Samuel Peck Past Medical History: SUBJECTIVE: patient/wife reports increased coughing with poor sputum production  FUNCTIONAL STATUS: maximum to total assist for all functional activities.  COGNITIVE STATUS: Alert and oriented x4  OBSERVATION: Breathing 5/95% anterior chest/diaphragm with initiation at diaphragm; 2:1 inspiration: expiration, shallow breathing with cues for appropriate technique. No accessory muscle use noted, Forward head, rounded shoulders. Skin Integrity - skin turgor poor B UE, B UE cool distally, warm proximally to palpation Lines - PEG, LUE IV, trach collar humidified air  O2 use: 6LO2 via trach collar  Chest PT Assessment Intervention:  postural drainage with focus on promoting mobilization of secretions with percussion  ASSESSMENT INTERVENTION: Pre-intervention BP 99/49 HR 80 SaO2 93% 6LO2 via trach collar, RR: 27 diaphragm dominant Sputum: no sputum noted Cough weak, ineffective at producing secretions Auscultation in short sit (anterior fields) decreased breath sounds bilaterally intermittent wheeze noted on expiration L lower lobes. (posterior fields) L upper: decreased breath sounds L lower lobe; crackles R upper lobe: minimal breath sounds R middle lobe: decreased breath sounds noted throughout with crackles noted on inspiration/expiration, R lower lobe: decreased breath sounds and crackles noted on inspiration and expiration.   INTERVENTION: diaphragmatic breathing, modified postural drainage (Left/right quarter turn side lying percussion 3x3 minutes bilaterally; manual vibration one trial each side while coughing, with marked increase in sputum production noted. Instructional cueing/demonstration in huffing technique with pt able to produce more force POST INTERVENTION: BP 114/64 HR 92 O2 97% 6LO2 via trach collar RR 26  Sputum: no able to produce spontaneously; white sputum with deep suction Patient noted with increased coughing after intervention, requires suction by RN after unable to produce sputum with cues for huffing technique COUGH: spontaneous, wet, weak, non-productive, deep suction performed by RN Auscultation in short sit: Improved breath sounds throughout, Right equal to left. Intermittent rhonchi noted on inspiration right/left lower lobes. Improved breath sounds in bilateral upper lobes.  ASSESSMENT: Patient responded well to percussion with rest breaks and education for pt and wife needed throughout the session. He demonstrated increased cough frequency and sputum production with interventions. He would benefit from continued chest PT including percussion and vibration in order to improve ventilation and perfusion, as  well as secretion mobilization and patient comfort in order to improve functional  abilities.  PLAN OF CARE: Modified postural drainage (positioning), incentive spirometry, percussion, huffing, active cycle breathing  He would benefit from further skilled chest physical therapy to maximize efficiency of breathing as well as secretion management and mobilization. PRECAUTIONS/CONTRAINDICATIONS TO CHEST PT: healing rib fractures, cleared by PA    Tx 2: Skilled co-treat with SLP for focus on sitting tolerance, breath support. Pt received seated in w/c, denies pain. BLE PROM while SLP performed oral care. Sitting tolerance performed while engaged in SLP vocalization tasks with upright sitting tolerance approximately 1 min before requesting semi-reclined rest break. Pool noodles inserted between LEs and w/c arm rests to promote neutral LE alignment. Remained seated in w/c at end of session, all needs in reach and pt verbalizing use of call bell if he needs assistance. PMSV removed at end of session.   Therapy Documentation Precautions:  Precautions Precautions: Fall, Other (comment) Precaution Comments: trach, PEG, T5 para Restrictions Weight Bearing Restrictions: No   See Function Navigator for Current Functional Status.   Therapy/Group: Individual Therapy and Co-Treatment  Samuel Peck 06/30/2016, 3:58 PM
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
## 2016-06-30 NOTE — Progress Notes (Signed)
Occupational Therapy Session Note  Patient Details  Name: Samuel LeventhalCharles Spurling MRN: [id number] Date of Birth: 1941-04-13  Today's Date: 06/30/2016 OT Individual Time: [id number] and 1345-1415 OT Individual Time Calculation (min): 90 min and 30 min    Short Term Goals:Week 1:  OT Short Term Goal 1 (Week 1): Pt will don shirt with supervision in supported chair OT Short Term Goal 2 (Week 1): Pt will tolerate circle sitting with mod A in prep for LB dressing  OT Short Term Goal 3 (Week 1): Pt will transfer via slide board bed<>w/c with max A+2  OT Short Term Goal 4 (Week 1): Pt will maintain static sitting balance on firm surface with mod A in prep for ADL tasks performed in sitting  OT Short Term Goal 5 (Week 1): Pt will perform one grooming tasks at sink with min A  Skilled Therapeutic Interventions/Progress Updates:    Session One: Pt seen for OT session focusing on ADL re-training and bed mobility in prep LB dressing tasks. Pt in supine upon arrival, boicing hving had a rough night sleeping due to coughing, however, pt motivated to cont with therapy. Pants donned total A in supine with +2 assist. Provided pt with leg loops (1 available at this time). Had pt practicing managing LE using loops to place LE into modified circle sit position. Assist required to maintain LE in this position, however, from this position pt able to don lotion to foot and assist with pulling up sock. He also used loops to manage LE in prep for rolling. Other leg loops to be made today.  He transferred into tilt-in-space w/c via maximove. Upon sitting up in chair pt reported feeling weak and not thinking he could tolerate uupright position due to fatigue. O2 levels >90%.  Lift used to transfer pt back to bed with +2 assist. Pt voiced feeling  He had incontinent episode, no stool present, but clean brief donned and hygiene completed. Pt left in supine at end of session, all needs in reach.   Session Two:  Pt seen for OT session  focusing on bed mobility and transitioning to upright positioning. Pt in supine upon arrival, RN present and agreeable to tx session.  Pt voiced having felt dizzy/ weak when upright with PT earlier in the day and with OT in AM session. At bed level, pt's BP 104/39. Abdominal binder then donned. Pt rolled with max- total A +2 to have maximove sling placed. He transferred via lift into tilt-in-space w/c with +2 assist. Pt cont to voice feeling lightheaded, pt reclined back in chair and reported feeling better. Pt left tilted in chair with hand off to SLP. Educated throughout session regarding BP management and effects with SCI.   Therapy Documentation Precautions:  Precautions Precautions: Fall, Other (comment) Precaution Comments: trach, PEG, T5 para Restrictions Weight Bearing Restrictions: No ADL: ADL ADL Comments: see functional navigator  See Function Navigator for Current Functional Status.   Therapy/Group: Individual Therapy  [name], Trey Gulbranson C 06/30/2016, 7:06 AM

## 2016-06-30 NOTE — Progress Notes (Signed)
[facility]     PROGRESS NOTE  Subjective/Complaints:  Pt seen laying in bed this AM.  He is more interactive.  He states he had a cough at night, which made it difficult for him to sleep.  He states he was taken done for U/S yesterday.  Spoke with nursing regarding enteric precautions.    ROS: Denies CP, SOB, N/V/D.  Objective: Vital Signs: Blood pressure 114/64, pulse 87, temperature 97.8 F (36.6 C), temperature source Oral, resp. rate 20, height 6\' 2"  (1.88 m), weight 83.6 kg (184 lb 4.8 oz), SpO2 96 %. [place] Chest  Result Date: 06/29/2016 CLINICAL DATA:  Assess LEFT pleural effusion. EXAM: CHEST ULTRASOUND COMPARISON:  Chest radiograph July 07, 2016 FINDINGS: No sonographic evidence of LEFT pleural effusion, no indicated pleurocentesis. IMPRESSION: No LEFT pleural effusion by sonogram. Electronically Signed   By: Awilda Metro M.D.   On: 06/29/2016 14:43   Dg Chest Port 1 View  Result Date: 06/29/2016 CLINICAL DATA:  Follow-up pleural effusion. Tracheostomy patient. 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 EXAM: PORTABLE CHEST 1 VIEW COMPARISON:  Chest x-ray of June 27, 2016 FINDINGS: The lungs are adequately inflated. The interstitial markings are increased bilaterally but not greatly changed. The left hemidiaphragm remains obscured. The cardiac silhouette is top-normal in size. The pulmonary vascularity is normal. There is calcification in the wall of the aortic arch. The tracheostomy appliance tip lies between the clavicular heads. The patient has undergone posterior fusion from T2 through T8. IMPRESSION: Stable appearance of the increased interstitial markings bilaterally consistent with pneumonia. Electronically Signed   By: David  [place] M.D.   On: 06/29/2016 08:43    Recent Labs  06/29/16 0957  WBC 10.2  HGB 11.0*  HCT 37.1*  PLT 272    Recent Labs  06/29/16 0957  NA 139  K 4.6  CL 107   GLUCOSE 175*  BUN 23*  CREATININE 0.32*  CALCIUM 8.3*   CBG (last 3)   Recent Labs  06/29/16 2350 06/30/16 0347 06/30/16 1141  GLUCAP 161* 124* 110*    Wt Readings from Last 3 Encounters:  06/28/16 83.6 kg (184 lb 4.8 oz)    Physical Exam:  BP 114/64 (BP Location: Left Arm)   Pulse 87   Temp 97.8 F (36.6 C) (Oral)   Resp 20   Ht 6\' 2"  (1.88 m)   Wt 83.6 kg (184 lb 4.8 oz)   SpO2 96%   BMI 23.66 kg/m  Constitutional: No distress. Vital signs reviewed.  HENT: Normocephalic. Atraumatic Eyes: EOMI. No discharge.  Neck: +trach.  Cardiovascular: RRR. No JVD. Respiratory: Effort normal. He has no wheezes. +ronchi.  GI: Soft. Bowel sounds are normal.  Musculoskeletal: He exhibits no edema, no tenderness.  Neurological: Alert. Motor: B/l UE 4+/5 proximal to distal. B/l LE: 0/5 (unchanged) Sensation absent to light touch b/l LE No increase in tone noted (stable).  Skin:  Stage II sacral decubitus, not examined Small DTI on 1st digit LLE (stable) Psychiatric:  Flat but cooperative  Assessment/Plan: 1. Functional deficits secondary to T5 fracture/SCI with paraplegia which require 3+ hours per day of interdisciplinary therapy in a comprehensive inpatient rehab setting. Physiatrist is providing close team supervision and 24 hour management of active medical problems listed below. Physiatrist and rehab team continue to assess barriers to discharge/monitor patient progress toward functional and medical goals.  Function:  Bathing Bathing position   Position: Wheelchair/chair at sink (Pt on  night bath; total A +2 for full body)  Bathing parts Body parts bathed by patient: Right arm, Left arm, Chest, Abdomen Body parts bathed by helper: Back  Bathing assist Assist Level: Touching or steadying assistance(Pt > 75%)      Upper Body Dressing/Undressing Upper body dressing   What is the patient wearing?: Pull over shirt/dress     Pull over shirt/dress - Perfomed by  patient: Thread/unthread right sleeve, Thread/unthread left sleeve, Put head through opening Pull over shirt/dress - Perfomed by helper: Pull shirt over trunk        Upper body assist Assist Level: 2 helpers      Lower Body Dressing/Undressing Lower body dressing   What is the patient wearing?: Pants, Socks, Shoes       Pants- Performed by helper: Thread/unthread right pants leg, Thread/unthread left pants leg, Pull pants up/down, Fasten/unfasten pants       Socks - Performed by helper: Don/doff right sock, Don/doff left sock   Shoes - Performed by helper: Don/doff right shoe, Don/doff left shoe, Fasten right, Fasten left          Lower body assist Assist for lower body dressing: 2 Designer, multimediaHelpers      Toileting Toileting Toileting activity did not occur: Safety/medical concerns        Toileting assist Assist level: Two helpers (per NT report)   Transfers Chair/bed transfer   Chair/bed transfer method: Lateral scoot Chair/bed transfer assist level: 2 helpers Chair/bed transfer assistive device: Mechanical lift Mechanical lift: Maximove   Locomotion Ambulation Ambulation activity did not occur: Safety/medical concerns         Wheelchair   Type: Motorized Max wheelchair distance: 150 Assist Level: Total assistance (Pt < 25%)  Cognition Comprehension Comprehension assist level: Understands basic 90% of the time/cues < 10% of the time  Expression Expression assist level: Expresses basic 50 - 74% of the time/requires cueing 25 - 49% of the time. Needs to repeat parts of sentences.  Social Interaction Social Interaction assist level: Interacts appropriately 90% of the time - Needs monitoring or encouragement for participation or interaction.  Problem Solving Problem solving assist level: Solves basic 50 - 74% of the time/requires cueing 25 - 49% of the time  Memory Memory assist level: Recognizes or recalls 75 - 89% of the time/requires cueing 10 - 24% of the time      Medical Problem List and Plan: 1. Functional and mobility deficitssecondary to T5 fracture/SCI with paraplegia. Multiple post- injury complications  Cont CIR  RLE PRAFO ordered 2. DVT Prophylaxis/Anticoagulation: Pharmaceutical:   Discussed with [facility] surgery, Eliquis resumed after discussion with pharmacy 3. Pain Management: tylenol, tramadol for more severe pain 4. Mood: team to provide ego support 5. Neuropsych: This patient is capable of making decisions on hisown behalf. 6. Skin/Wound Care: Air mattress overlay. Eucerin cream to bilateral feet. Maintain adequate nutritional and hydration status.   7. Fluids/Electrolytes/Nutrition:   Strict NPO at this time.   Consult dietician to assist with tube feeds.   Changed to more elemental form to see if this helps with ongoing diarrhea.   Will inquire about banana flakes  D/Ced IVF and increase water flushes. 8. Sacral decub Stage II: Has has a lot of muscle loss--added protein supplement to tube feeds. Added additional vitamins to help promote healing. WOC  9. Pseudomonas Aeruginosa UTI with Urosepsis: Has been afebrile on antibiotics.   Will d/c meropenum, course completed.   Foley changed, as has been in 30 days.  10. Acute renal failure: Resolved.   Cr within acceptable limits 12/18 11. VDRF with tracheostomy: Case discussed with PCCM NP--to continue CFS # 6 cough/endurance improves.   Ordered chest vest as well as hypertonic saline nebs X 48 hours to help with mobilize mucous/atelectasis.  12. H/o A fib s/p DCCV: Eliquis restarted. Monitor HR qid.  13. Recent C diff colitis: treated. Monitor for recurrence with antibiotics on board again.  14. Dysphagia: Continue NPO status. Speech therapy for PMSV and trials of ice chips as indicated. 15. DM type 2:   Hgb A1c 6.4  Monitor BS every 4 hours  Lantus 10 BID, increased to 13 on 12/19  Improving  Will cont to make further adjustments as necessary. 16. Protein calorie  Malnutrition: Increased rate of TF for until evaluated by RD.  17. Leukocytosis  WBCs 10.2 on 12/20  Cont to monitor 18. ABLA  Hb 1.0 on 12/20  Cont to monitor 19. Abnormal CXR  CXR 12/18 reviewed abnormalities, however U/S neg for effusion  Per pulm, less likely PNA  Appreciate Pulm recs, d/c Meropenum, cont intermittent diuresis 20. Neurogenic bladder  Scheduled I/O cath 21. Hypoalbuminemia  Improving 1.9 on 12/20   Cont supplementation    LOS (Days) 6 A FACE TO FACE EVALUATION WAS PERFORMED  Ankit Karis Jubanil Patel 06/30/2016 1:15 PM

## 2016-06-30 NOTE — Progress Notes (Signed)
Speech Language Pathology Daily Session Note  Patient Details  Name: Samuel LeventhalCharles Peck MRN: [id number] Date of Birth: 09/06/1940  Today's Date: 06/30/2016 SLP Individual Co-Treatment Time: 1415-1430 (co-tx with PT(LT) 1415-1500) Individual co-treatment minutes: 15 minutes    Short Term Goals: Week 1: SLP Short Term Goal 1 (Week 1): Pt will wear PMSV for 60 minutes without changes in vital signs or complaints of discomfort over 2 consecutive sessions.   SLP Short Term Goal 2 (Week 1): Pt will consume therapeutic trials of ice chips without overt s/s of aspiration and min assist for use of swallowing precautions over 3 consecutive sessions prior to repeat objective assessment.  SLP Short Term Goal 3 (Week 1): Pt will orally expectorate secretions in 50% of opportunities with min assist verbal cues.   SLP Short Term Goal 4 (Week 1): Pt will utilize increased vocal intensity and overarticulation to achieve intelligibility at the phrase level with min assist verbal cues.   SLP Short Term Goal 5 (Week 1): Pt will utilize external aids to facilitate recall of daily information with min assist verbal cues.    Skilled Therapeutic Interventions: Skilled co-treatment with PT focused on speech goals. Upon arrival, patient upright in the tilt-in-space wheelchair and received from OT with PMSV in place. Patient tolerated valve for over 60 minutes with all vitals remaining WFL and without reports of shortness of breath. Patient performed diaphragmatic breathing exercises with Mod A verbal and tactile cues for accuracy. Patient with increased verbal expression this session and was ~90% intelligible at the phrase and sentence level with Min A verbal cues. Patient with multiple attempts to orally expectorate secretions but unable to do so despite assistance from PT. Patient requested to remain sitting up in wheelchair. Patient left semi-reclined, PMSV was removed with call bell in place and all needs within reach.  Continue with current plan of care.   Function:   Cognition Comprehension Comprehension assist level: Understands basic 90% of the time/cues < 10% of the time  Expression Expression assistive device: Talk trach valve Expression assist level: Expresses basic 75 - 89% of the time/requires cueing 10 - 24% of the time. Needs helper to occlude trach/needs to repeat words.  Social Interaction Social Interaction assist level: Interacts appropriately 90% of the time - Needs monitoring or encouragement for participation or interaction.  Problem Solving Problem solving assist level: Solves basic 50 - 74% of the time/requires cueing 25 - 49% of the time  Memory Memory assist level: Recognizes or recalls 75 - 89% of the time/requires cueing 10 - 24% of the time    Pain No/Denies Pain   Therapy/Group: Individual Therapy  Samuel Peck 06/30/2016, 3:11 PM

## 2016-06-30 NOTE — Discharge Instructions (Signed)

## 2016-06-30 NOTE — Progress Notes (Signed)
Social Work Patient ID: Samuel LeventhalCharles Peck, male   DOB: [birth year], 75 y.o.   MRN: [id number]   CSW spoke with pt's wife on 06-28-16 after team conference and shared with pt's wife targeted d/c date of 07-22-16.  She appreciated that pt would be here 3 more weeks.  CSW explained that we review pt weekly during conference and that the date can change as needed based on pt's progress.  She expressed understanding.

## 2016-06-30 NOTE — Progress Notes (Signed)
CPT wasn't done on 12121/17 1st round OT was with the Pt 2nd round PT, Nurse and the [name] was working with the Pt 3rd round The Pt was in a chair and RT wasn't able to manipulat the Pt to get the vest on him

## 2016-07-01 ENCOUNTER — Inpatient Hospital Stay (HOSPITAL_COMMUNITY): Payer: No Typology Code available for payment source | Admitting: Occupational Therapy

## 2016-07-01 ENCOUNTER — Inpatient Hospital Stay (HOSPITAL_COMMUNITY): Payer: No Typology Code available for payment source | Admitting: Speech Pathology

## 2016-07-01 ENCOUNTER — Inpatient Hospital Stay (HOSPITAL_COMMUNITY): Payer: Medicare Other | Admitting: Physical Therapy

## 2016-07-01 ENCOUNTER — Inpatient Hospital Stay (HOSPITAL_COMMUNITY): Payer: Medicare Other | Admitting: Occupational Therapy

## 2016-07-01 DIAGNOSIS — K592 Neurogenic bowel, not elsewhere classified: Secondary | ICD-10-CM

## 2016-07-01 LAB — GLUCOSE, CAPILLARY
GLUCOSE-CAPILLARY: 137 mg/dL — AB (ref 65–99)
GLUCOSE-CAPILLARY: 198 mg/dL — AB (ref 65–99)
Glucose-Capillary: 151 mg/dL — ABNORMAL HIGH (ref 65–99)
Glucose-Capillary: 171 mg/dL — ABNORMAL HIGH (ref 65–99)
Glucose-Capillary: 185 mg/dL — ABNORMAL HIGH (ref 65–99)
Glucose-Capillary: 185 mg/dL — ABNORMAL HIGH (ref 65–99)
Glucose-Capillary: 191 mg/dL — ABNORMAL HIGH (ref 65–99)

## 2016-07-01 MED ORDER — INSULIN GLARGINE 100 UNIT/ML ~~LOC~~ SOLN
15.0000 [IU] | Freq: Two times a day (BID) | SUBCUTANEOUS | Status: DC
  Administered 2016-07-01 – 2016-07-04 (×6): 15 [IU] via SUBCUTANEOUS
  Filled 2016-07-01 (×7): qty 0.15

## 2016-07-01 NOTE — Progress Notes (Signed)
Speech Language Pathology Daily Session Note  Patient Details  Name: Samuel LeventhalCharles Borello MRN: [id number] Date of Birth: [birth year]  Today's Date: 07/01/2016 SLP Individual Time: 1450-1530 SLP Individual Time Calculation (min): 40 min   Short Term Goals: Week 2: SLP Short Term Goal 1 (Week 2): Patient will donn/doff PMSV with Min A verbal cues.  SLP Short Term Goal 2 (Week 2): Patient will wear PMSV with full supervision with all vitals remaining WFL.  SLP Short Term Goal 3 (Week 2): Patient will perform pharyngeal strengthening exercises with Max A multimodal cues.  SLP Short Term Goal 4 (Week 2): Patient will perform diaphragmatic breathing exercises with Mod A multimodal cues.  SLP Short Term Goal 5 (Week 2): Pt will orally expectorate secretions in 575% of opportunities with Min A verbal cues.    Skilled Therapeutic Interventions:  Pt was seen for skilled ST targeting goals for communication and dysphagia.  Pt wearing speaking valve upon arrival with good toleration noted throughout today's therapy session.  No changes in vital signs and no reports of discomfort endorsed by patient.  Vocal quality was audibly wet upon arrival and pt was actively attempting to clear secretions throughout treatment.  Pt was able to orally expectorate secretions with min verbal cues in 50% of opportunities with noticeable improvement in vocal quality.  Pt was able to return demonstration for use of incentive spirometer for 500 mL for 3x10 repetitions with distant supervision to continue working towards improved cough strength for airway protection during the swallow and improved breath support for speech.  Pt needed min-mod assist verbal cues for use of increased vocal intelligibility to achieve intelligibility at the sentence level.  Pt was left resting in bed with call bell and suction within reach.  Continue per current plan of care.    Function:  Eating Eating                 Cognition Comprehension  Comprehension assist level: Understands basic 90% of the time/cues < 10% of the time  Expression Expression assistive device: Talk trach valve Expression assist level: Expresses basic 50 - 74% of the time/requires cueing 25 - 49% of the time. Needs to repeat parts of sentences.  Social Interaction Social Interaction assist level: Interacts appropriately 90% of the time - Needs monitoring or encouragement for participation or interaction.  Problem Solving Problem solving assist level: Solves basic 50 - 74% of the time/requires cueing 25 - 49% of the time  Memory Memory assist level: Recognizes or recalls 75 - 89% of the time/requires cueing 10 - 24% of the time    Pain Pain Assessment Pain Assessment: No/denies pain  Therapy/Group: Individual Therapy  Deanza Upperman, Melanee SpryNicole L 07/01/2016, 5:07 PM

## 2016-07-01 NOTE — Progress Notes (Signed)
Physical Therapy Session Note  Patient Details  Name: Samuel LeventhalCharles Peck MRN: [id number] Date of Birth: [birth year]  Today's Date: 07/01/2016 PT Individual Time: 1000-1100 PT Individual Time Calculation (min): 60 min    Short Term Goals: Week 1:  PT Short Term Goal 1 (Week 1): Pt will initiate bed mobility training with leg loops, bed rails and max A of one person PT Short Term Goal 2 (Week 1): Pt will tolerate sitting edge of mat x 5 minutes with max A of one person performing dynamic sitting balance activities PT Short Term Goal 3 (Week 1): Pt will perform bed <> w/c transfers with slideboard and max A of one person on level surfaces PT Short Term Goal 4 (Week 1): Pt will tolerate sitting up in tilt in space w/c x 4 hours calling for boosting/pressure relief every 30 min with 50% reminder cues  Skilled Therapeutic Interventions/Progress Updates:   Pt received supine in bed, wife present; denies pain and agreeable to treatment. Session with focus on chest PT; transfer to w/c at end of session +2 with transfer board. Instructed pt to alert staff when ready to go back to bed.  ASSESSMENT INTERVENTION: Pre-intervention BP 129/49 HR 99 SaO2 93% 6LO2 via trach collar RR 26 Sputum: no sputum noted  Cough weak, non-productive.  Auscultation in short sit (anterior fields) decreased breath sounds bilaterally intermittent rhonchi noted on expiration bilateral lower lobes. (posterior fields) L upper lobe: decreased breath sounds throughout no adventitious sounds noted; R upper lobe: decreased breath sounds & R middle lobe: decreased breath sounds noted throughout with rhonchi noted on expiration, R lower lobe: decreased breath sounds on inspiration and rhonchi noted on and expiration.   INTERVENTION: diaphragmatic breathing, modified postural drainage (Left side lying with quarter turn anterior in left sidelying and percussion 3x3 minutes bilaterally; with marked increase in sputum production noted.    POST  INTERVENTION: BP 139/59 HR 82 O2 97% 6LO2 via trach collar RR 26 Sputum: copious amounts, moderate thickness, white with yellow tinge, no odor noted. Patient noted with 3 episodes of coughing and sputum production during and after percussion/vibration.  COUGH: spontaneous, wet, weak, productive Auscultation in short sit: Improved breath sounds throughout however decreased on inspiration throughout all lobes. Right diminished more than left. Intermittent rhonchi noted on expiration on bilateral lower lobes and R middle.  ASSESSMENT: Patient responded well to percussion with rest breaks and re-education needed throughout the session. He demonstrated increased cough frequency and sputum production with interventions. He would benefit from continued chest PT including percussion and vibration in order to improve ventilation and perfusion, as well as secretion mobilization and patient comfort in order to improve functional abilities.   Therapy Documentation Precautions:  Precautions Precautions: Fall, Other (comment) Precaution Comments: trach, PEG, T5 para Restrictions Weight Bearing Restrictions: No Vital Signs: Therapy Vitals Pulse Rate: 77 Resp: 18 Patient Position (if appropriate): Lying Oxygen Therapy SpO2: 99 % O2 Device: Tracheostomy Collar O2 Flow Rate (L/min): 6 L/min FiO2 (%): 28 %   See Function Navigator for Current Functional Status.   Therapy/Group: Individual Therapy  [name] 07/01/2016, 12:40 PM

## 2016-07-01 NOTE — Progress Notes (Signed)
Occupational Therapy Weekly Progress Note  Patient Details  Name: Samuel Peck MRN: [id number] Date of Birth: 1941-02-05  Beginning of progress report period: June 25, 2016 End of progress report period: July 01, 2016  Today's Date: 07/01/2016 OT Individual Time: 0700-0807 and 1350-1405 OT Individual Time Calculation (min): 67 min and 15 min OT Missed Time: 15 min (pt fatigue)   Patient has met 1 of 5 short term goals.  Pt making slow but steady gains in OT. He cont to be limited by decreased activty tolerance and management of secretions as he requires frequent suctioning. Pt also severely deconditioned limiting amount he is able to actively participate in tx sessions despite internal motivation and desire to improve.   Patient continues to demonstrate the following deficits: cognitive deficits, muscle weakness (generalized) and paraplegia at level T5 and therefore will continue to benefit from skilled OT intervention to enhance overall performance with BADL and Reduce care partner burden.  Patient progressing toward long term goals..  Continue plan of care.  OT Short Term Goals Week 1:  OT Short Term Goal 1 (Week 1): Pt will don shirt with supervision in supported chair OT Short Term Goal 1 - Progress (Week 1): Not met OT Short Term Goal 2 (Week 1): Pt will tolerate circle sitting with mod A in prep for LB dressing  OT Short Term Goal 2 - Progress (Week 1): Partly met OT Short Term Goal 3 (Week 1): Pt will transfer via slide board bed<>w/c with max A+2  OT Short Term Goal 3 - Progress (Week 1): Not met OT Short Term Goal 4 (Week 1): Pt will maintain static sitting balance on firm surface with mod A in prep for ADL tasks performed in sitting  OT Short Term Goal 4 - Progress (Week 1): Not met OT Short Term Goal 5 (Week 1): Pt will perform one grooming tasks at sink with min A OT Short Term Goal 5 - Progress (Week 1): Met Week 2:  OT Short Term Goal 1 (Week 2): Pt will roll  with mod A using leg loops during ADL task in order to decrease caregiver burden OT Short Term Goal 2 (Week 2): Pt will dress UB with set-up assist seated in chair OT Short Term Goal 3 (Week 2): Pt will thread B LEs into pants in circle sit position with mod A OT Short Term Goal 4 (Week 2): Pt will transfer via sliding board with max A +1 to reduce caregiver burden   Skilled Therapeutic Interventions/Progress Updates:    Session ONe: Pt seen for OT session focusing on ADL re-training and upright tolerance. Pt in supine upon arrival, voicing having had a better nights rest and agreeable to tx session. Pt postitioned with HOB elevated and with total A placed in modified circle sit position with R LE. With Assist to manage clothing and LE, pt able to thread R LE into pants. Remainder of LB dressing completed total A with +2 to roll. Pt's BP 126/65 in supine. TED hose donned prior to transfer OOB.  Maximove with +2 assist used to transfer pt to tilt-in-space w/c. In supported sitting, pt able to don shirt with min A, able to thread it on and pul self forward in chair for therapist to assist with pulling down in back. Grooming completed seated in w/c at sink.Pt with LOB to L when sitting upright in chair, requiring max A to regain balance and min A overall to maintain upright balance while grooming. Pt left tilted slightly  in w/c at end of session, all needs in reach awaiting SLP session. Pt on 5L O2 throughout session with 02 remaining >90% throughout session.   Session Two: Pt seen for OT session focusing on education and practice with use of leg loops. Pt in supine upon arrival, voicing having felt sick earlier and denying OOB activity due to fear of emesis. Pt agreeable to attempt bed level exercises practicing with leg loops. He was able to roll to R/L managing LEs with leg loops. Required mod A for shifting of hips to complete full roll into sidelying position. Following rolls to both sides, pt voiced  desire to cease tx session due to fatigue. Pt repositioned back in supine, all needs in reach.     Therapy Documentation Precautions:  Precautions Precautions: Fall, Other (comment) Precaution Comments: trach, PEG, T5 para Restrictions Weight Bearing Restrictions: No ADL: ADL ADL Comments: see functional navigator  See Function Navigator for Current Functional Status.   Therapy/Group: Individual Therapy  [name], Gwinda Passe 07/01/2016, 6:39 AM

## 2016-07-01 NOTE — Progress Notes (Signed)
[place] PHYSICAL MEDICINE & REHABILITATION     PROGRESS NOTE  Subjective/Complaints:  Pt seen sitting up in her chair this AM.  He slept well overnight.  He states his breathing has improved. Pulm saw patient yesterday.    ROS: Denies CP, SOB, N/V/D.  Objective: Vital Signs: Blood pressure 118/60, pulse 77, temperature 98.2 F (36.8 C), temperature source Oral, resp. rate 18, height 6\' 2"  (1.88 m), weight 83.2 kg (183 lb 6.4 oz), SpO2 99 %. [place] [facility]  Result Date: 06/29/2016 CLINICAL DATA:  Assess LEFT pleural effusion. EXAM: CHEST ULTRASOUND COMPARISON:  Chest radiograph July 07, 2016 FINDINGS: No sonographic evidence of LEFT pleural effusion, no indicated pleurocentesis. IMPRESSION: No LEFT pleural effusion by sonogram. Electronically Signed   By: Awilda Metroourtnay  Bloomer M.D.   On: 06/29/2016 14:43    Recent Labs  06/29/16 0957  WBC 10.2  HGB 11.0*  HCT 37.1*  PLT 272    Recent Labs  06/29/16 0957  NA 139  K 4.6  CL 107  GLUCOSE 175*  BUN 23*  CREATININE 0.32*  CALCIUM 8.3*   CBG (last 3)   Recent Labs  07/01/16 0015 07/01/16 0434 07/01/16 0749  GLUCAP 137* 191* 171*    Wt Readings from Last 3 Encounters:  06/30/16 83.2 kg (183 lb 6.4 oz)    Physical Exam:  BP 118/60 (BP Location: Right Arm)   Pulse 77   Temp 98.2 F (36.8 C) (Oral)   Resp 18   Ht 6\' 2"  (1.88 m)   Wt 83.2 kg (183 lb 6.4 oz)   SpO2 99%   BMI 23.55 kg/m  Constitutional: NAD. Vital signs reviewed.  HENT: Normocephalic. Atraumatic Eyes: EOMI. No discharge.  Neck: +trach.  Cardiovascular: RRR. No JVD. Respiratory: Effort normal. He has no wheezes. +Upper airway sounds.  GI: Soft. Bowel sounds are normal.  Musculoskeletal: He exhibits no edema, no tenderness.  Neurological: Alert. Motor: B/l UE 4-/5 proximal to distal. B/l LE: 0/5 (stable) Sensation absent to light touch b/l LE No increase in tone noted (unchanged).  Skin:  Stage II sacral decubitus, not examined Small DTI on  1st digit LLE (stable) Psychiatric:  Flat but cooperative  Assessment/Plan: 1. Functional deficits secondary to T5 fracture/SCI with paraplegia which require 3+ hours per day of interdisciplinary therapy in a comprehensive inpatient rehab setting. Physiatrist is providing close team supervision and 24 hour management of active medical problems listed below. Physiatrist and rehab team continue to assess barriers to discharge/monitor patient progress toward functional and medical goals.  Function:  Bathing Bathing position   Position: Bed  Bathing parts Body parts bathed by patient: Right arm, Left arm, Chest, Abdomen Body parts bathed by helper: Right arm, Left arm, Front perineal area, Chest, Abdomen, Right upper leg, Buttocks, Left upper leg, Left lower leg, Right lower leg, Back  Bathing assist Assist Level: Touching or steadying assistance(Pt > 75%)      Upper Body Dressing/Undressing Upper body dressing   What is the patient wearing?: Pull over shirt/dress     Pull over shirt/dress - Perfomed by patient: Thread/unthread right sleeve, Thread/unthread left sleeve, Put head through opening Pull over shirt/dress - Perfomed by helper: Pull shirt over trunk        Upper body assist Assist Level: 2 helpers      Lower Body Dressing/Undressing Lower body dressing   What is the patient wearing?: Pants, Socks, Shoes, [employer]     Pants- Performed by patient: Thread/unthread right pants leg Pants- Performed  by helper: Thread/unthread left pants leg, Pull pants up/down       Socks - Performed by helper: Don/doff right sock, Don/doff left sock   Shoes - Performed by helper: Don/doff right shoe, Don/doff left shoe, Fasten right, Fasten left       TED Hose - Performed by helper: Don/doff right TED hose, Don/doff left TED hose  Lower body assist Assist for lower body dressing: 2 Helpers      Toileting Toileting Toileting activity did not occur: Safety/medical concerns         Toileting assist Assist level: Two helpers (per NT report)   Transfers Chair/bed transfer   Chair/bed transfer method: Lateral scoot Chair/bed transfer assist level: 2 helpers Chair/bed transfer assistive device: Mechanical lift Mechanical lift: Maximove   Locomotion Ambulation Ambulation activity did not occur: Safety/medical concerns         Wheelchair   Type: Motorized Max wheelchair distance: 150 Assist Level: Total assistance (Pt < 25%)  Cognition Comprehension Comprehension assist level: Understands basic 90% of the time/cues < 10% of the time  Expression Expression assist level: Expresses basic 75 - 89% of the time/requires cueing 10 - 24% of the time. Needs helper to occlude trach/needs to repeat words.  Social Interaction Social Interaction assist level: Interacts appropriately 90% of the time - Needs monitoring or encouragement for participation or interaction.  Problem Solving Problem solving assist level: Solves basic 50 - 74% of the time/requires cueing 25 - 49% of the time  Memory Memory assist level: Recognizes or recalls 75 - 89% of the time/requires cueing 10 - 24% of the time     Medical Problem List and Plan: 1. Functional and mobility deficitssecondary to T5 fracture/SCI with paraplegia. Multiple post- injury complications  Cont CIR  RLE PRAFO ordered 2. DVT Prophylaxis/Anticoagulation: Pharmaceutical:   Discussed with WF surgery, Eliquis resumed after discussion with pharmacy 3. Pain Management: tylenol, tramadol for more severe pain 4. Mood: team to provide ego support 5. Neuropsych: This patient is capable of making decisions on hisown behalf. 6. Skin/Wound Care: Air mattress overlay. Eucerin cream to bilateral feet. Maintain adequate nutritional and hydration status.   7. Fluids/Electrolytes/Nutrition:   Strict NPO at this time.   Consult dietician to assist with tube feeds.   Changed to more elemental form, appears to have helped.   Will  inquire about banana flakes  D/Ced IVF and increase water flushes. 8. Sacral decub Stage II: Has has a lot of muscle loss--added protein supplement to tube feeds. Added additional vitamins to help promote healing. WOC  9. Pseudomonas Aeruginosa UTI with Urosepsis: Has been afebrile on antibiotics.   D/ced meropenum, course completed.   Foley changed, as has been in 30 days.  10. Acute renal failure: Resolved.   Cr within acceptable limits 12/18 11. VDRF with tracheostomy: Case discussed with PCCM NP--to continue CFS # 6 cough/endurance improves.   Ordered chest vest as well as hypertonic saline nebs X 48 hours to help with mobilize mucous/atelectasis.  12. H/o A fib s/p DCCV: Eliquis restarted. Monitor HR qid.  13. Recent C diff colitis: treated. Monitor for recurrence with antibiotics on board again.  14. Dysphagia: Continue NPO status. Speech therapy for PMSV and trials of ice chips as indicated. 15. DM type 2:   Hgb A1c 6.4  Monitor BS  Lantus 10 BID, increased to 13 on 12/19, increased to 12/22  Improving  Will cont to monitor 16. Protein calorie Malnutrition: Increased rate of TF for until evaluated  by RD.  17. Leukocytosis  WBCs 10.2 on 12/20  Cont to monitor 18. ABLA  Hb 1.0 on 12/20  Cont to monitor 19. Abnormal CXR  CXR 12/18 reviewed abnormalities, however U/S neg for effusion  Per pulm, less likely PNA  Appreciate Pulm recs, d/ced Meropenum, cont intermittent diuresis 20. Neurogenic bladder/bowel  Scheduled I/O cath  Will consider bowel program 21. Hypoalbuminemia  Improving 1.9 on 12/20   Cont supplementation    LOS (Days) 7 A FACE TO FACE EVALUATION WAS PERFORMED  [place] Karis Juba 07/01/2016 10:06 AM

## 2016-07-01 NOTE — Progress Notes (Signed)
Speech Language Pathology Weekly Progress and Session Note  Patient Details  Name: Samuel Peck MRN: [id number] Date of Birth: 1940-12-26  Beginning of progress report period: June 24, 2016 End of progress report period: July 01, 2016  Today's Date: 07/01/2016 SLP Individual Time: 8338-2505 SLP Individual Time Calculation (min): 55 min   Short Term Goals: Week 1: SLP Short Term Goal 1 (Week 1): Pt will wear PMSV for 60 minutes without changes in vital signs or complaints of discomfort over 2 consecutive sessions.   SLP Short Term Goal 1 - Progress (Week 1): Met SLP Short Term Goal 2 (Week 1): Pt will consume therapeutic trials of ice chips without overt s/s of aspiration and min assist for use of swallowing precautions over 3 consecutive sessions prior to repeat objective assessment.  SLP Short Term Goal 2 - Progress (Week 1): Discontinued (comment) SLP Short Term Goal 3 (Week 1): Pt will orally expectorate secretions in 50% of opportunities with min assist verbal cues.   SLP Short Term Goal 3 - Progress (Week 1): Met SLP Short Term Goal 4 (Week 1): Pt will utilize increased vocal intensity and overarticulation to achieve intelligibility at the phrase level with min assist verbal cues.   SLP Short Term Goal 4 - Progress (Week 1): Not met SLP Short Term Goal 5 (Week 1): Pt will utilize external aids to facilitate recall of daily information with min assist verbal cues.   SLP Short Term Goal 5 - Progress (Week 1): Met    New Short Term Goals: Week 2: SLP Short Term Goal 1 (Week 2): Patient will donn/doff PMSV with Min A verbal cues.  SLP Short Term Goal 2 (Week 2): Patient will wear PMSV with full supervision with all vitals remaining WFL.  SLP Short Term Goal 3 (Week 2): Patient will perform pharyngeal strengthening exercises with Max A multimodal cues.  SLP Short Term Goal 4 (Week 2): Patient will perform diaphragmatic breathing exercises with Mod A multimodal cues.  SLP  Short Term Goal 5 (Week 2): Pt will orally expectorate secretions in 575% of opportunities with Min A verbal cues.    Weekly Progress Updates: Patient has made functional gains and has met 3 of 5 STG's this reporting period. Currently, patient is tolerating his PMSV for ~60 minutes with vitals remaining WFL. Patient continues to demonstrate a hoarse vocal quality with low vocal intensity due to deconditioning and poor breath support. Patient is demonstrating increased verbal expression and increased vocal intensity at the phrase level with use of diaphragmatic breathing and Mod A verbal cues. Due to patient's poor respiratory status and overall endurance, trials have not been attempted per PA request. However, patient is making gains in ability to orally expectorate secretions. Patient is attempting to perform pharyngeal strengthening exercises but continues to require total A for accuracy. Patient and family education is ongoing. Patient would benefit from continued skilled SLP intervention to maximize his speech and swallowing function and overall functional independence prior to discharge.    Intensity: Minumum of 1-2 x/day, 30 to 90 minutes Frequency: 3 to 5 out of 7 days Duration/Length of Stay: 07/22/16 Treatment/Interventions: English as a second language teacher;Dysphagia/aspiration precaution training;Functional tasks;Internal/external aids;Patient/family education;Cognitive remediation/compensation;Therapeutic Activities;Speech/Language facilitation   Daily Session  Skilled Therapeutic Interventions: Skilled treatment session focused on dysphagia and speech goals. Upon arrival, patient was awake while upright in the wheelchair. SLP facilitated session by providing thorough oral care to clear thick, dried secretions from his hard and soft palate. Patient wore PMSV throughout session with intermittent desaturation,  however, patient did not appear in distress so suspect could be malfunction of pulse ox. Patient with  decreased wet vocal quality and was able to orally expectorate his secretions in 50% of opportunities with assistance from the suction. Patient reported he felt "weak" and requested to get back into bed. Patient transferred via the [facility] with +2 assist. Patient continues to demonstrate increased vocal intensity with Min A verbal cues to achieve 100% intelligibility. Patient left supine in bed with wife present. Continue with current plan of care.      Function:   Cognition Comprehension Comprehension assist level: Understands basic 90% of the time/cues < 10% of the time  Expression Expression assistive device: Talk trach valve Expression assist level: Expresses basic 50 - 74% of the time/requires cueing 25 - 49% of the time. Needs to repeat parts of sentences.  Social Interaction Social Interaction assist level: Interacts appropriately 90% of the time - Needs monitoring or encouragement for participation or interaction.  Problem Solving Problem solving assist level: Solves basic 50 - 74% of the time/requires cueing 25 - 49% of the time  Memory Memory assist level: Recognizes or recalls 75 - 89% of the time/requires cueing 10 - 24% of the time   Pain No/Denies Pain   Therapy/Group: Individual Therapy  Dion Parrow, [place] 07/01/2016, 3:51 PM

## 2016-07-01 NOTE — Plan of Care (Signed)
Problem: SCI BLADDER ELIMINATION Goal: RH STG MANAGE BLADDER WITH EQUIPMENT WITH ASSISTANCE STG Manage Bladder With Equipment With max  Assistance   Outcome: Not Progressing Patient receiving I/O caths performed by staff q6 hours

## 2016-07-02 ENCOUNTER — Inpatient Hospital Stay (HOSPITAL_COMMUNITY): Payer: Medicare Other | Admitting: Physical Therapy

## 2016-07-02 ENCOUNTER — Inpatient Hospital Stay (HOSPITAL_COMMUNITY): Payer: No Typology Code available for payment source | Admitting: Speech Pathology

## 2016-07-02 DIAGNOSIS — I158 Other secondary hypertension: Secondary | ICD-10-CM

## 2016-07-02 LAB — GLUCOSE, CAPILLARY
GLUCOSE-CAPILLARY: 153 mg/dL — AB (ref 65–99)
GLUCOSE-CAPILLARY: 157 mg/dL — AB (ref 65–99)
GLUCOSE-CAPILLARY: 168 mg/dL — AB (ref 65–99)
GLUCOSE-CAPILLARY: 171 mg/dL — AB (ref 65–99)
Glucose-Capillary: 173 mg/dL — ABNORMAL HIGH (ref 65–99)
Glucose-Capillary: 169 mg/dL — ABNORMAL HIGH (ref 65–99)

## 2016-07-02 LAB — PROCALCITONIN: Procalcitonin: 0.11 ng/mL

## 2016-07-02 MED ORDER — BISACODYL 10 MG RE SUPP
10.0000 mg | Freq: Every day | RECTAL | Status: DC
  Administered 2016-07-03 – 2016-07-06 (×4): 10 mg via RECTAL
  Filled 2016-07-02 (×4): qty 1

## 2016-07-02 MED ORDER — SENNOSIDES-DOCUSATE SODIUM 8.6-50 MG PO TABS
1.0000 | ORAL_TABLET | Freq: Every day | ORAL | Status: DC
  Administered 2016-07-02 – 2016-07-07 (×5): 1 via ORAL
  Filled 2016-07-02 (×5): qty 1

## 2016-07-02 NOTE — Plan of Care (Signed)
Problem: SCI BLADDER ELIMINATION Goal: RH STG MANAGE BLADDER WITH EQUIPMENT WITH ASSISTANCE STG Manage Bladder With Equipment With max  Assistance   Outcome: Not Progressing I/O caths continued by staff

## 2016-07-02 NOTE — Progress Notes (Signed)
[place] PHYSICAL MEDICINE & REHABILITATION     PROGRESS NOTE  Subjective/Complaints:  Pt seen laying in bed this AM.  He states he slept well overnight.  He does not have any complaints this AM.   ROS: Denies CP, SOB, N/V/D.  Objective: Vital Signs: Blood pressure (!) 143/51, pulse 72, temperature 97.7 F (36.5 C), temperature source Oral, resp. rate 18, height 6\' 2"  (1.88 m), weight 85 kg (187 lb 6.3 oz), SpO2 98 %. No results found. No results for input(s): WBC, HGB, HCT, PLT in the last 72 hours. No results for input(s): NA, K, CL, GLUCOSE, BUN, CREATININE, CALCIUM in the last 72 hours.  Invalid input(s): CO CBG (last 3)   Recent Labs  07/01/16 2351 07/02/16 0356 07/02/16 0850  GLUCAP 185* 173* 168*    Wt Readings from Last 3 Encounters:  07/01/16 85 kg (187 lb 6.3 oz)    Physical Exam:  BP (!) 143/51   Pulse 72   Temp 97.7 F (36.5 C) (Oral)   Resp 18   Ht 6\' 2"  (1.88 m)   Wt 85 kg (187 lb 6.3 oz)   SpO2 98%   BMI 24.06 kg/m  Constitutional: NAD. Vital signs reviewed.  HENT: Normocephalic. Atraumatic Eyes: EOMI. No discharge.  Neck: +trach.  Cardiovascular: RRR. No JVD. Respiratory: Effort normal. He has no wheezes. +Upper airway sounds persistent.  GI: Soft. Bowel sounds are normal.  Musculoskeletal: He exhibits no edema, no tenderness.  Neurological: Alert. Motor: B/l UE 4-/5 proximal to distal. B/l LE: 0/5 (unchanged.) Sensation absent to light touch b/l LE No increase in tone noted (unchanged).  Skin:  Stage II sacral decubitus, not examined Small DTI on 1st digit LLE, not examined today Psychiatric: Flat but cooperative  Assessment/Plan: 1. Functional deficits secondary to T5 fracture/SCI with paraplegia which require 3+ hours per day of interdisciplinary therapy in a comprehensive inpatient rehab setting. Physiatrist is providing close team supervision and 24 hour management of active medical problems listed below. Physiatrist and rehab  team continue to assess barriers to discharge/monitor patient progress toward functional and medical goals.  Function:  Bathing Bathing position   Position: Bed  Bathing parts Body parts bathed by patient: Right arm, Left arm, Chest, Abdomen Body parts bathed by helper: Right arm, Left arm, Front perineal area, Chest, Abdomen, Right upper leg, Buttocks, Left upper leg, Left lower leg, Right lower leg, Back  Bathing assist Assist Level: Touching or steadying assistance(Pt > 75%)      Upper Body Dressing/Undressing Upper body dressing   What is the patient wearing?: Pull over shirt/dress     Pull over shirt/dress - Perfomed by patient: Thread/unthread right sleeve, Thread/unthread left sleeve, Put head through opening Pull over shirt/dress - Perfomed by helper: Pull shirt over trunk        Upper body assist Assist Level: 2 helpers      Lower Body Dressing/Undressing Lower body dressing   What is the patient wearing?: Pants, Socks, Shoes, [employer]- Performed by patient: Thread/unthread right pants leg Pants- Performed by helper: Thread/unthread left pants leg, Pull pants up/down       Socks - Performed by helper: Don/doff right sock, Don/doff left sock   Shoes - Performed by helper: Don/doff right shoe, Don/doff left shoe, Fasten right, Fasten left       TED Hose - Performed by helper: Don/doff right TED hose, Don/doff left TED hose  Lower body assist Assist for lower body dressing: 2  Helpers      Financial traderToileting Toileting Toileting activity did not occur: Safety/medical concerns        Toileting assist Assist level: Two helpers (per NT report)   Transfers Chair/bed transfer   Chair/bed transfer method: Lateral scoot Chair/bed transfer assist level: 2 helpers Chair/bed transfer assistive device: Mechanical lift Mechanical lift: Maximove   Locomotion Ambulation Ambulation activity did not occur: Safety/medical concerns         Wheelchair   Type:  Motorized Max wheelchair distance: 150 Assist Level: Total assistance (Pt < 25%)  Cognition Comprehension Comprehension assist level: Understands basic 90% of the time/cues < 10% of the time  Expression Expression assist level: Expresses basic 75 - 89% of the time/requires cueing 10 - 24% of the time. Needs helper to occlude trach/needs to repeat words.  Social Interaction Social Interaction assist level: Interacts appropriately 90% of the time - Needs monitoring or encouragement for participation or interaction.  Problem Solving Problem solving assist level: Solves basic 50 - 74% of the time/requires cueing 25 - 49% of the time  Memory Memory assist level: Recognizes or recalls 75 - 89% of the time/requires cueing 10 - 24% of the time     Medical Problem List and Plan: 1. Functional and mobility deficitssecondary to T5 fracture/SCI with paraplegia. Multiple post- injury complications  Cont CIR  RLE PRAFO ordered 2. DVT Prophylaxis/Anticoagulation: Pharmaceutical:   Discussed with WF surgery, Eliquis resumed after discussion with pharmacy 3. Pain Management: tylenol, tramadol for more severe pain 4. Mood: team to provide ego support 5. Neuropsych: This patient is capable of making decisions on hisown behalf. 6. Skin/Wound Care: Air mattress overlay. Eucerin cream to bilateral feet. Maintain adequate nutritional and hydration status.   7. Fluids/Electrolytes/Nutrition:   Strict NPO at this time.   Consult dietician to assist with tube feeds.   Changed to more elemental form, appears to have helped.   Inquiring about banana flakes  D/Ced IVF and increase water flushes. 8. Sacral decub Stage II: Has has a lot of muscle loss--added protein supplement to tube feeds. Added additional vitamins to help promote healing. WOC  9. Pseudomonas Aeruginosa UTI with Urosepsis: Has been afebrile on antibiotics.   D/ced meropenum, course completed.   Foley changed, as has been in 30 days.  10.  Acute renal failure: Resolved.   Cr within acceptable limits 12/18 11. VDRF with tracheostomy: Case discussed with PCCM NP--to continue CFS # 6 cough/endurance improves.   Ordered chest vest as well as hypertonic saline nebs X 48 hours to help with mobilize mucous/atelectasis.  12. H/o A fib s/p DCCV: Eliquis restarted. Monitor HR qid.  13. Recent C diff colitis: treated. Monitor for recurrence with antibiotics on board again.  14. Dysphagia: Continue NPO status. Speech therapy for PMSV and trials of ice chips as indicated. 15. DM type 2:   Hgb A1c 6.4  Monitor BS  Lantus 10 BID, increased to 13 on 12/19, increased to 15U on 12/22  Will likely need further increase tomorrow   Will cont to monitor 16. Protein calorie Malnutrition: Increased rate of TF for until evaluated by RD.  17. Leukocytosis  WBCs 10.2 on 12/20  Cont to monitor 18. ABLA  Hb 11.0 on 12/20  Cont to monitor 19. Abnormal CXR  CXR 12/18 reviewed abnormalities, however U/S neg for effusion  Per pulm, less likely PNA  Appreciate Pulm recs, d/ced Meropenum, cont intermittent diuresis 20. Neurogenic bladder/bowel  Scheduled I/O cath  Started bowel program 21. Hypoalbuminemia  Improving 1.9 on 12/20   Cont supplementation 22. HTN  Likely reactionary, will cont to monitor    LOS (Days) 8 A FACE TO FACE EVALUATION WAS PERFORMED  Irlanda Croghan Karis Jubanil Adore Kithcart 07/02/2016 10:55 AM

## 2016-07-02 NOTE — Progress Notes (Signed)
Physical Therapy Weekly Progress Note  Patient Details  Name: Samuel Peck MRN: [id number] Date of Birth: [birth year]  Beginning of progress report period: June 25, 2016 End of progress report period: July 02, 2016  Today's Date: 07/02/2016 PT Individual Time: 0915-1005 PT Individual Time Calculation (min): 50 min    Patient has met 2 of 4 short term goals.  Pt making slow progress towards functional goals. Primarily limited by decreased oxygen support, inspiratory/expiratory muscle weakness, and increased secretions. Pt has been participating in chest PT to improve mobilization of secretions with good response. Functional mobility limited by shortness of breath, however pt able to sustain sitting balance edge of mat with BUE support and close S x1 min before fatigued and requiring rest breaks. Pt has initiated use of leg loops for bed mobility, however limited due to UE strength deficits associated with prolonged bedrest. Performing lateral scoot transfers w/c <>bed/mat with transfer board and maxA. [name] present for most PT sessions, she is very involved and eager to participate in education and hands-on training.   Patient continues to demonstrate the following deficits muscle weakness and muscle paralysis, decreased cardiorespiratoy endurance and decreased oxygen support and decreased sitting balance, decreased postural control and decreased balance strategies and therefore will continue to benefit from skilled PT intervention to increase functional independence with mobility.  Patient progressing toward long term goals..  Continue plan of care.  PT Short Term Goals Week 1:  PT Short Term Goal 1 (Week 1): Pt will initiate bed mobility training with leg loops, bed rails and max A of one person PT Short Term Goal 1 - Progress (Week 1): Not met PT Short Term Goal 2 (Week 1): Pt will tolerate sitting edge of mat x 5 minutes with max A of one person performing dynamic sitting  balance activities PT Short Term Goal 2 - Progress (Week 1): Met PT Short Term Goal 3 (Week 1): Pt will perform bed <> w/c transfers with slideboard and max A of one person on level surfaces PT Short Term Goal 3 - Progress (Week 1): Met PT Short Term Goal 4 (Week 1): Pt will tolerate sitting up in tilt in space w/c x 4 hours calling for boosting/pressure relief every 30 min with 50% reminder cues PT Short Term Goal 4 - Progress (Week 1): Not met Week 2:  PT Short Term Goal 1 (Week 2): Pt will perform bed mobility with use of leg loops and modA +1 PT Short Term Goal 2 (Week 2): Pt will perform transfers w/c <> bed/mat level surface with modA +1 PT Short Term Goal 3 (Week 2): Pt will demonstrate sitting tolerance of 5 min edge of mat with dynamic activity modA  PT Short Term Goal 4 (Week 2): Pt will tolerate sitting up in tilt in space w/c x 4 hours calling for boosting/pressure relief every 30 min with 50% reminder cues  Skilled Therapeutic Interventions/Progress Updates:   Pt received semi-reclined in bed, denies pain and agreeable to treatment. Pants donned totalA bed level. Transfer at end of session bed>w/c with transfer board and maxA +2. Pt with significantly improving upright tolerance; able to sit while repositioning lines/leads, place board and transfer board, completed transfer all before requiring reclined rest break. See below for chest PT details.   ASSESSMENT INTERVENTION: Pre-intervention BP 143/51 HR 72 SaO2 96% 5LO2 via trach collar, RR: 26 Sputum: no sputum noted  Cough: weak, non-productive  Auscultation in short sit (anterior fields): bilateral lower lobes increased crackles, improving breath  sounds upper lobes. (posterior fields) Right/left upper lobes: clear to ausculation, decreased inspiratory:expiratory ratio. Left lower lobe: mild crackles noted, improving breath sounds INTERVENTION: modified postural drainage, Left/right side lying percussion 3x3 minutes bilaterally POST  INTERVENTION: BP 146/61 HR 92 O2 96% 5LO2 via trach collar RR 25 Sputum: none produced during session, pt with minimal coughing, per RN pt had deep suction 15 min prior to PT session.   COUGH: spontaneous, wet, weak, non-productive Auscultation in short sit: mild crackles noted bilateral lower lobes, R middle lobe, improving breath sounds ASSESSMENT: Patient responded well to percussion with rest breaks and re-education needed throughout the session. Decreased cough and secretion production this session, likely due to decreased secretions noted in auscultation. He would benefit from continued chest PT including percussion and vibration in order to improve ventilation and perfusion, as well as secretion mobilization and patient comfort in order to improve functional abilities.   Therapy Documentation Precautions:  Precautions Precautions: Fall, Other (comment) Precaution Comments: trach, PEG, T5 para Restrictions Weight Bearing Restrictions: No   See Function Navigator for Current Functional Status.  Therapy/Group: Individual Therapy  Luberta Mutter 07/02/2016, 11:28 AM

## 2016-07-02 NOTE — Progress Notes (Addendum)
Speech Language Pathology Daily Session Note  Patient Details  Name: Samuel LeventhalCharles Babers MRN: [id number] Date of Birth: [birth year]  Today's Date: 07/02/2016 SLP Individual Time: 1300-1345 SLP Individual Time Calculation (min): 45 min   Short Term Goals: Week 2: SLP Short Term Goal 1 (Week 2): Patient will donn/doff PMSV with Min A verbal cues.  SLP Short Term Goal 2 (Week 2): Patient will wear PMSV with full supervision with all vitals remaining WFL.  SLP Short Term Goal 3 (Week 2): Patient will perform pharyngeal strengthening exercises with Max A multimodal cues.  SLP Short Term Goal 4 (Week 2): Patient will perform diaphragmatic breathing exercises with Mod A multimodal cues.  SLP Short Term Goal 5 (Week 2): Pt will orally expectorate secretions in 575% of opportunities with Min A verbal cues.    Skilled Therapeutic Interventions:  Pt was seen for skilled ST targeting goals for speech and dysphagia.  RN was completing oral care upon therapist's arrival.  Pt was able to orally expectorate small amounts of thin, clear secretions in 75% of opportunities with supervision verbal cues.  Pt wore speaking valve for duration of today's therapy session with no complaints of discomfort and no changes in vital signs.  Pt was able to complete 5x10 repetitions with incentive spirometer at 375-500 mL with supervision verbal cues to continue working towards increased cough strength and breath support for speech.  Pt was also able to return demonstration of effortful swallow technique for 3x10 repetitions with supervision verbal cues.  Pt was left in wheelchair with quick release belt donned and call bell left within reach.  Continue per current plan of care.    Function:  Eating Eating                Cognition Comprehension Comprehension assist level: Understands basic 90% of the time/cues < 10% of the time  Expression Expression assistive device: Talk trach valve Expression assist level: Expresses  basic 75 - 89% of the time/requires cueing 10 - 24% of the time. Needs helper to occlude trach/needs to repeat words.  Social Interaction Social Interaction assist level: Interacts appropriately 90% of the time - Needs monitoring or encouragement for participation or interaction.  Problem Solving Problem solving assist level: Solves basic 50 - 74% of the time/requires cueing 25 - 49% of the time  Memory Memory assist level: Recognizes or recalls 75 - 89% of the time/requires cueing 10 - 24% of the time    Pain Pain Assessment Pain Assessment: No/denies pain  Therapy/Group: Individual Therapy  Kaziah Krizek, Melanee SpryNicole L 07/02/2016, 3:34 PM

## 2016-07-03 ENCOUNTER — Inpatient Hospital Stay (HOSPITAL_COMMUNITY): Payer: Medicare Other | Admitting: Physical Therapy

## 2016-07-03 ENCOUNTER — Inpatient Hospital Stay (HOSPITAL_COMMUNITY): Payer: Medicare Other | Admitting: Occupational Therapy

## 2016-07-03 ENCOUNTER — Inpatient Hospital Stay (HOSPITAL_COMMUNITY): Payer: No Typology Code available for payment source | Admitting: Speech Pathology

## 2016-07-03 DIAGNOSIS — R0989 Other specified symptoms and signs involving the circulatory and respiratory systems: Secondary | ICD-10-CM

## 2016-07-03 LAB — GLUCOSE, CAPILLARY
GLUCOSE-CAPILLARY: 123 mg/dL — AB (ref 65–99)
GLUCOSE-CAPILLARY: 126 mg/dL — AB (ref 65–99)
Glucose-Capillary: 152 mg/dL — ABNORMAL HIGH (ref 65–99)
Glucose-Capillary: 141 mg/dL — ABNORMAL HIGH (ref 65–99)
Glucose-Capillary: 147 mg/dL — ABNORMAL HIGH (ref 65–99)

## 2016-07-03 MED ORDER — GUAIFENESIN-DM 100-10 MG/5ML PO SYRP
5.0000 mL | ORAL_SOLUTION | Freq: Three times a day (TID) | ORAL | Status: DC
  Administered 2016-07-03 – 2016-07-05 (×7): 10 mL via ORAL
  Filled 2016-07-03 (×7): qty 10

## 2016-07-03 MED ORDER — ALBUTEROL SULFATE (2.5 MG/3ML) 0.083% IN NEBU: 2.5000 mg | INHALATION_SOLUTION | RESPIRATORY_TRACT | Status: DC | PRN

## 2016-07-03 NOTE — Progress Notes (Signed)
Physical Therapy Session Note  Patient Details  Name: Samuel Peck MRN: [id number] Date of Birth: 17-Jun-1941  Today's Date: 07/03/2016 PT Individual Time: 1220-1329 PT Individual Time Calculation (min): 69 min     Therapy Documentation Precautions:  Precautions Precautions: Fall, Other (comment) Precaution Comments: trach, PEG, T5 para Restrictions Weight Bearing Restrictions: No General:   Vital Signs: Therapy Vitals Temp: 98.2 F (36.8 C) Temp Source: Oral Pulse Rate: 77 Resp: 16 BP: 110/69 Patient Position (if appropriate): Sitting Oxygen Therapy SpO2: 99 % O2 Device: Tracheostomy Collar O2 Flow Rate (L/min): 6 L/min FiO2 (%): 28 % Pain: Pain Assessment Pain Assessment: No/denies pain  ASSESSMENT INTERVENTION: Pre-intervention BP 106/53 HR 72 SaO2 96% 5LO2 via trach collar, RR: 26 Sputum: copious amounts moderate thickness, no odor, clear. Cough: weak, wet, productive, spontaneous Auscultation in short sit (anterior fields): bilateral lower lobes increased crackles, improving breath sounds upper lobes. (posterior fields) Right/left upper lobes: clear to ausculation, decreased inspiratory:expiratory ratio. Left lower lobe: mild crackles noted, improving breath sounds INTERVENTION: modified postural drainage, Left/right side lying percussion 3x3 minutes bilaterally POST INTERVENTION:  HR 70 O2 98% 5LO2 via trach collar RR 21 Sputum: copious amounts moderate thickness, no odor,yellow clear. COUGH: spontaneous, wet, weak, productive Auscultation in short sit: mild crackles noted bilateral lower lobes, R middle lobe, improving breath sounds ASSESSMENT: Patient responded well to percussion with rest breaks and re-education needed throughout the session. Increased cough and secretion production this session. He would benefit from continued chest PT including percussion and vibration in order to improve ventilation and perfusion, as well as secretion mobilization and  patient comfort in order to improve functional abilities.   Patient managing secretions has improved significantly.  Mod assist for rolling to the right and left  Right sidelying to seated edge of bed max assist.  Total assist bed to wheelchair.   Patient remained seated upright at end of session in tilt in space wheelchair with all needs met.  See Function Navigator for Current Functional Status.   Therapy/Group: Individual Therapy  Retta Diones 07/03/2016, 3:08 PM

## 2016-07-03 NOTE — Progress Notes (Signed)
[place] [facility] MEDICINE & REHABILITATION     PROGRESS NOTE  Subjective/Complaints:  Pt seen laying in bed this AM.  He did not sleep well overnight because he had secretions.  Per nursing, they have been thick.   ROS: Denies CP, SOB, N/V/D.  Objective: Vital Signs: Blood pressure (!) 150/67, pulse 79, temperature 98.2 F (36.8 C), temperature source Oral, resp. rate 18, height 6\' 2"  (1.88 m), weight 85 kg (187 lb 6.3 oz), SpO2 99 %. No results found. No results for input(s): WBC, HGB, HCT, PLT in the last 72 hours. No results for input(s): NA, K, CL, GLUCOSE, BUN, CREATININE, CALCIUM in the last 72 hours.  Invalid input(s): CO CBG (last 3)   Recent Labs  07/03/16 0000 07/03/16 0351 07/03/16 0842  GLUCAP 153* 152* 123*    Wt Readings from Last 3 Encounters:  07/01/16 85 kg (187 lb 6.3 oz)    Physical Exam:  BP (!) 150/67 (BP Location: Left Arm)   Pulse 79   Temp 98.2 F (36.8 C) (Oral)   Resp 18   Ht 6\' 2"  (1.88 m)   Wt 85 kg (187 lb 6.3 oz)   SpO2 99%   BMI 24.06 kg/m  Constitutional: NAD. Vital signs reviewed.  HENT: Normocephalic. Atraumatic Eyes: EOMI. No discharge.  Neck: +trach.  Cardiovascular: RRR. No JVD. Respiratory: Effort normal. He has no wheezes. +Ronchi.  GI: Soft. Bowel sounds are normal.  Musculoskeletal: He exhibits no edema, no tenderness.  Neurological: Alert. Motor: B/l UE 4-/5 proximal to distal. B/l LE: 0/5 (stable) Sensation absent to light touch b/l LE No increase in tone noted (unchanged).  Skin:  Stage II sacral decubitus, not examined Small DTI on 1st digit LLE, not examined today Psychiatric: Flat but cooperative  Assessment/Plan: 1. Functional deficits secondary to T5 fracture/SCI with paraplegia which require 3+ hours per day of interdisciplinary therapy in a comprehensive inpatient rehab setting. Physiatrist is providing close team supervision and 24 hour management of active medical problems listed  below. Physiatrist and rehab team continue to assess barriers to discharge/monitor patient progress toward functional and medical goals.  Function:  Bathing Bathing position   Position: Bed  Bathing parts Body parts bathed by patient: Right arm, Left arm, Chest, Abdomen Body parts bathed by helper: Right arm, Left arm, Front perineal area, Chest, Abdomen, Right upper leg, Buttocks, Left upper leg, Left lower leg, Right lower leg, Back  Bathing assist Assist Level: Touching or steadying assistance(Pt > 75%)      Upper Body Dressing/Undressing Upper body dressing   What is the patient wearing?: Pull over shirt/dress     Pull over shirt/dress - Perfomed by patient: Thread/unthread right sleeve, Thread/unthread left sleeve, Put head through opening Pull over shirt/dress - Perfomed by helper: Pull shirt over trunk        Upper body assist Assist Level: 2 helpers      Lower Body Dressing/Undressing Lower body dressing   What is the patient wearing?: Pants, Socks, Shoes, [employer] Station[employer] Hose     Pants- Performed by patient: Thread/unthread right pants leg Pants- Performed by helper: Thread/unthread left pants leg, Pull pants up/down       Socks - Performed by helper: Don/doff right sock, Don/doff left sock   Shoes - Performed by helper: Don/doff right shoe, Don/doff left shoe, Fasten right, Fasten left       TED Hose - Performed by helper: Don/doff right TED hose, Don/doff left TED hose  Lower body assist Assist for lower  body dressing: 2 Designer, multimediaHelpers      Toileting Toileting Toileting activity did not occur: Safety/medical concerns        Toileting assist Assist level: Two helpers (per NT report)   Transfers Chair/bed transfer   Chair/bed transfer method: Lateral scoot Chair/bed transfer assist level: 2 helpers Chair/bed transfer assistive device: Sliding board, Armrests Mechanical lift: Maximove   Locomotion Ambulation Ambulation activity did not occur: Safety/medical  concerns         Wheelchair   Type: Motorized Max wheelchair distance: 150 Assist Level: Total assistance (Pt < 25%)  Cognition Comprehension Comprehension assist level: Understands basic 90% of the time/cues < 10% of the time  Expression Expression assist level: Expresses basic 75 - 89% of the time/requires cueing 10 - 24% of the time. Needs helper to occlude trach/needs to repeat words.  Social Interaction Social Interaction assist level: Interacts appropriately 90% of the time - Needs monitoring or encouragement for participation or interaction.  Problem Solving Problem solving assist level: Solves basic 50 - 74% of the time/requires cueing 25 - 49% of the time  Memory Memory assist level: Recognizes or recalls 75 - 89% of the time/requires cueing 10 - 24% of the time     Medical Problem List and Plan: 1. Functional and mobility deficitssecondary to T5 fracture/SCI with paraplegia. Multiple post- injury complications  Cont CIR  RLE PRAFO ordered 2. DVT Prophylaxis/Anticoagulation: Pharmaceutical:   Discussed with [facility] surgery, Eliquis resumed after discussion with pharmacy 3. Pain Management: tylenol, tramadol for more severe pain 4. Mood: team to provide ego support 5. Neuropsych: This patient is capable of making decisions on hisown behalf. 6. Skin/Wound Care: Air mattress overlay. Eucerin cream to bilateral feet. Maintain adequate nutritional and hydration status.   7. Fluids/Electrolytes/Nutrition:   Strict NPO at this time.   Consult dietician to assist with tube feeds.   Changed to more elemental form, appears to have helped.   Inquiring about banana flakes  D/Ced IVF and increase water flushes. 8. Sacral decub Stage II: Has has a lot of muscle loss--added protein supplement to tube feeds. Added additional vitamins to help promote healing. WOC  9. Pseudomonas Aeruginosa UTI with Urosepsis: Has been afebrile on antibiotics.   D/ced meropenum, course completed.    Foley changed, as has been in 30 days.  10. Acute renal failure: Resolved.   Cr within acceptable limits 12/18 11. VDRF with tracheostomy: Case discussed with PCCM NP--to continue CFS # 6 cough/endurance improves.   Ordered chest vest as well as hypertonic saline nebs X 48 hours to help with mobilize mucous/atelectasis.  12. H/o A fib s/p DCCV: Eliquis restarted. Monitor HR qid.  13. Recent C diff colitis: treated. Monitor for recurrence with antibiotics on board again.  14. Dysphagia: Continue NPO status. Speech therapy for PMSV and trials of ice chips as indicated. 15. DM type 2:   Hgb A1c 6.4  Monitor BS  Lantus 10 BID, increased to 13 on 12/19, increased to 15U on 12/22  Improving, will consider further increase tomorrow if necessary   Will cont to monitor 16. Protein calorie Malnutrition: Increased rate of TF for until evaluated by RD.  17. Leukocytosis  WBCs 10.2 on 12/20  Cont to monitor 18. ABLA  Hb 11.0 on 12/20  Cont to monitor 19. Abnormal CXR  CXR 12/18 reviewed abnormalities, however U/S neg for effusion  Per pulm, less likely PNA  Appreciate Pulm recs, d/ced Meropenum, cont intermittent diuresis  Scheduled Robitussin DM 20. Neurogenic bladder/bowel  Scheduled I/O cath  Cont bowel program  21. Hypoalbuminemia  Improving 1.9 on 12/20   Cont supplementation 22. HTN  Likely reactive, labile at present, will cont to monitor    LOS (Days) 9 A FACE TO FACE EVALUATION WAS PERFORMED  Ankit Karis Juba 07/03/2016 9:23 AM

## 2016-07-03 NOTE — Progress Notes (Signed)
Physical Therapy Session Note  Patient Details  Name: Samuel LeventhalCharles Christopoulos MRN: [id number] Date of Birth: [birth year]  Today's Date: 07/03/2016 PT Individual Time: [id number] PT Individual Time Calculation (min): 60 min    Short Term Goals: Week 2:  PT Short Term Goal 1 (Week 2): Pt will perform bed mobility with use of leg loops and modA +1 PT Short Term Goal 2 (Week 2): Pt will perform transfers w/c <> bed/mat level surface with modA +1 PT Short Term Goal 3 (Week 2): Pt will demonstrate sitting tolerance of 5 min edge of mat with dynamic activity modA  PT Short Term Goal 4 (Week 2): Pt will tolerate sitting up in tilt in space w/c x 4 hours calling for boosting/pressure relief every 30 min with 50% reminder cues  Skilled Therapeutic Interventions/Progress Updates:   Pt received supine in bed, denies pain and agreeable to treatment. Initiated rolling to sit EOB however pt noted to have had incontinent bowel movement. Rolling R/L with maxA and bedrails to change brief and perform hygiene totalA. Supine>sit with pt using leg loops to get LEs over EOB, max to perform R sidelying>sit. Transfer bed>w/c with transfer board and maxA +2, improving push through UEs. Repositioning in chair with pt providing assist through LEs. Educated pt on secretion management and preference of self-clearing vs suction.  Remained semi-reclined in w/c at end of session, all needs in reach. Discussed with RN recommendation pt stay up for 1.5-2 hrs before returning to bed or as pt requests.   Therapy Documentation Precautions:  Precautions Precautions: Fall, Other (comment) Precaution Comments: trach, PEG, T5 para Restrictions Weight Bearing Restrictions: No   See Function Navigator for Current Functional Status.   Therapy/Group: Individual Therapy  [name] 07/03/2016, 8:46 AM

## 2016-07-03 NOTE — Progress Notes (Signed)
Speech Language Pathology Daily Session Note  Patient Details  Name: Samuel Peck MRN: [id number] Date of Birth: [birth year]  Today's Date: 07/03/2016 SLP Individual Time: 1045-1130 SLP Individual Time Calculation (min): 45 min   Short Term Goals:Week 2: SLP Short Term Goal 1 (Week 2): Patient will donn/doff PMSV with Min A verbal cues.  SLP Short Term Goal 2 (Week 2): Patient will wear PMSV with full supervision with all vitals remaining WFL.  SLP Short Term Goal 3 (Week 2): Patient will perform pharyngeal strengthening exercises with Max A multimodal cues.  SLP Short Term Goal 4 (Week 2): Patient will perform diaphragmatic breathing exercises with Mod A multimodal cues.  SLP Short Term Goal 5 (Week 2): Pt will orally expectorate secretions in 575% of opportunities with Min A verbal cues.    Skilled Therapeutic Interventions: Skilled treatment session focused on addressing speech and swallow goals. Upon SLP arrival patient with PMSV donned and tolerated well for 45 minutes session with SpO2 drops x2 during times of coughing up and orally expectorating secretions.  After they were cleared [place]als [place] [facility].  SLP facilitated session with set-up and total assist for completion of oral care via suctioning.  Patient able to orally expectorate secretions in 100% of trials with extra time today; secretions were clear but thick and stringy.  Follow oral expectoration x5 patient with clear vocal quality throughout session.  Patient completed inhalation with incentive spirometer at 500 mL + x3 with supervision verbal cues to  work toward increased cough strength and breath support for speech.  Patient was also able to return demonstrate 10 effortful swallows x3 repetitions with supervision verbal cues.  SLP encouraged patient to complete exercises tomorrow with nursing or family assist.  Hopeful to initiate PO trials with medical clearance in the next couple days.  Continue with current plan of  care.   Function:  Eating Eating Eating activity did not occur: Safety/medical concerns               Cognition Comprehension Comprehension assist level: Understands basic 90% of the time/cues < 10% of the time  Expression Expression assistive device: Talk trach valve Expression assist level: Expresses basic 75 - 89% of the time/requires cueing 10 - 24% of the time. Needs helper to occlude trach/needs to repeat words.  Social Interaction Social Interaction assist level: Interacts appropriately 90% of the time - Needs monitoring or encouragement for participation or interaction.  Problem Solving Problem solving assist level: Solves basic 50 - 74% of the time/requires cueing 25 - 49% of the time  Memory Memory assist level: Recognizes or recalls 75 - 89% of the time/requires cueing 10 - 24% of the time    Pain Pain Assessment Pain Assessment: No/denies pain  Therapy/Group: Individual Therapy  Charlane FerrettiMelissa Hoorain Kozakiewicz, M.A., CCC-SLP [phone]  Gyselle Matthew 07/03/2016, 1:16 PM

## 2016-07-03 NOTE — Plan of Care (Signed)
Problem: SCI BLADDER ELIMINATION Goal: RH STG MANAGE BLADDER WITH ASSISTANCE STG Manage Bladder With  Max Assistance ( Currently has foley cath). Foley care every shift and PRN   Outcome: Completed/Met Date Met: 07/03/16 Foley D/C'd  Goal: RH STG MANAGE BLADDER WITH EQUIPMENT WITH ASSISTANCE STG Manage Bladder With Equipment With max  Assistance   Outcome: Progressing I/O caths q6 hours continued

## 2016-07-03 NOTE — Progress Notes (Signed)
CPT being done by Physical Therapist

## 2016-07-03 NOTE — Progress Notes (Signed)
Manuel chest PT was already done by therapist this AM.

## 2016-07-04 LAB — BASIC METABOLIC PANEL
Anion gap: 5 (ref 5–15)
BUN: 18 mg/dL (ref 6–20)
CO2: 33 mmol/L — ABNORMAL HIGH (ref 22–32)
Calcium: 8.6 mg/dL — ABNORMAL LOW (ref 8.9–10.3)
Chloride: 100 mmol/L — ABNORMAL LOW (ref 101–111)
Creatinine, Ser: <0.3 mg/dL — ABNORMAL LOW (ref 0.61–1.24)
Glucose, Bld: 140 mg/dL — ABNORMAL HIGH (ref 65–99)
POTASSIUM: 4.2 mmol/L (ref 3.5–5.1)
SODIUM: 138 mmol/L (ref 135–145)

## 2016-07-04 LAB — GLUCOSE, CAPILLARY
GLUCOSE-CAPILLARY: 170 mg/dL — AB (ref 65–99)
GLUCOSE-CAPILLARY: 191 mg/dL — AB (ref 65–99)
Glucose-Capillary: 137 mg/dL — ABNORMAL HIGH (ref 65–99)
Glucose-Capillary: 173 mg/dL — ABNORMAL HIGH (ref 65–99)
Glucose-Capillary: 203 mg/dL — ABNORMAL HIGH (ref 65–99)
Glucose-Capillary: 203 mg/dL — ABNORMAL HIGH (ref 65–99)
Glucose-Capillary: 206 mg/dL — ABNORMAL HIGH (ref 65–99)

## 2016-07-04 MED ORDER — INSULIN GLARGINE 100 UNIT/ML ~~LOC~~ SOLN
18.0000 [IU] | Freq: Two times a day (BID) | SUBCUTANEOUS | Status: DC
  Administered 2016-07-04 – 2016-07-08 (×8): 18 [IU] via SUBCUTANEOUS
  Filled 2016-07-04 (×9): qty 0.18

## 2016-07-04 NOTE — Progress Notes (Signed)
[place] PHYSICAL MEDICINE & REHABILITATION     PROGRESS NOTE  Subjective/Complaints:  Pt seen laying in bed this AM.  He slept well overnight.  He states his secretions have improved, allowing him to sleep better.  ROS: Denies CP, SOB, N/V/D.  Objective: Vital Signs: Blood pressure (!) 126/51, pulse 92, temperature 97.9 F (36.6 C), temperature source Oral, resp. rate 20, height 6\' 2"  (1.88 m), weight 85 kg (187 lb 6.3 oz), SpO2 93 %. No results found. No results for input(s): WBC, HGB, HCT, PLT in the last 72 hours.  Recent Labs  07/04/16 0525  NA 138  K 4.2  CL 100*  GLUCOSE 140*  BUN 18  CREATININE <0.30*  CALCIUM 8.6*   CBG (last 3)   Recent Labs  07/04/16 0013 07/04/16 0355 07/04/16 0742  GLUCAP 170* 173* 137*    Wt Readings from Last 3 Encounters:  07/01/16 85 kg (187 lb 6.3 oz)    Physical Exam:  BP (!) 126/51 (BP Location: Left Arm)   Pulse 92   Temp 97.9 F (36.6 C) (Oral)   Resp 20   Ht 6\' 2"  (1.88 m)   Wt 85 kg (187 lb 6.3 oz)   SpO2 93%   BMI 24.06 kg/m  Constitutional: NAD. Vital signs reviewed.  HENT: Normocephalic. Atraumatic Eyes: EOMI. No discharge.  Neck: +trach.  Cardiovascular: RRR. No JVD. Respiratory: Effort normal. He has no wheezes. +Ronchi (improving).  GI: Soft. Bowel sounds are normal.  Musculoskeletal: He exhibits no edema, no tenderness.  Neurological: Alert. Motor: B/l UE 4-/5 proximal to distal. B/l LE: 0/5 (unchanged) Sensation absent to light touch b/l LE No increase in tone noted (unchanged).  Skin:  Stage II sacral decubitus, not examined Small DTI on 1st digit LLE, not examined today Psychiatric: Flat but cooperative  Assessment/Plan: 1. Functional deficits secondary to T5 fracture/SCI with paraplegia which require 3+ hours per day of interdisciplinary therapy in a comprehensive inpatient rehab setting. Physiatrist is providing close team supervision and 24 hour management of active medical problems listed  below. Physiatrist and rehab team continue to assess barriers to discharge/monitor patient progress toward functional and medical goals.  Function:  Bathing Bathing position   Position: Bed  Bathing parts Body parts bathed by patient: Right arm, Left arm, Chest, Abdomen Body parts bathed by helper: Right arm, Left arm, Front perineal area, Chest, Abdomen, Right upper leg, Buttocks, Left upper leg, Left lower leg, Right lower leg, Back  Bathing assist Assist Level: Touching or steadying assistance(Pt > 75%)      Upper Body Dressing/Undressing Upper body dressing   What is the patient wearing?: Pull over shirt/dress     Pull over shirt/dress - Perfomed by patient: Thread/unthread right sleeve, Thread/unthread left sleeve, Put head through opening Pull over shirt/dress - Perfomed by helper: Pull shirt over trunk        Upper body assist Assist Level: 2 helpers      Lower Body Dressing/Undressing Lower body dressing   What is the patient wearing?: Pants, Socks, Shoes, [employer] Hose     Pants- Performed by patient: Thread/unthread right pants leg Pants- Performed by helper: Thread/unthread left pants leg, Pull pants up/down       Socks - Performed by helper: Don/doff right sock, Don/doff left sock   Shoes - Performed by helper: Don/doff right shoe, Don/doff left shoe, Fasten right, Fasten left       TED Hose - Performed by helper: Don/doff right TED hose, Don/doff left TED  hose  Lower body assist Assist for lower body dressing: 2 Helpers      Toileting Toileting Toileting activity did not occur: No continent bowel/bladder event        Toileting assist Assist level: Two helpers (per NT report)   Transfers Chair/bed transfer   Chair/bed transfer method: Lateral scoot Chair/bed transfer assist level: 2 helpers Chair/bed transfer assistive device: Sliding board, Armrests Mechanical lift: Maximove   Locomotion Ambulation Ambulation activity did not occur: Safety/medical  concerns         Wheelchair   Type: Motorized Max wheelchair distance: 150 Assist Level: Total assistance (Pt < 25%)  Cognition Comprehension Comprehension assist level: Understands basic 90% of the time/cues < 10% of the time  Expression Expression assist level: Expresses basic 75 - 89% of the time/requires cueing 10 - 24% of the time. Needs helper to occlude trach/needs to repeat words.  Social Interaction Social Interaction assist level: Interacts appropriately 90% of the time - Needs monitoring or encouragement for participation or interaction.  Problem Solving Problem solving assist level: Solves basic 50 - 74% of the time/requires cueing 25 - 49% of the time  Memory Memory assist level: Recognizes or recalls 75 - 89% of the time/requires cueing 10 - 24% of the time     Medical Problem List and Plan: 1. Functional and mobility deficitssecondary to T5 fracture/SCI with paraplegia. Multiple post- injury complications  Cont CIR  RLE PRAFO ordered 2. DVT Prophylaxis/Anticoagulation: Pharmaceutical:   Discussed with WF surgery, Eliquis resumed after discussion with pharmacy 3. Pain Management: tylenol, tramadol for more severe pain 4. Mood: team to provide ego support 5. Neuropsych: This patient is capable of making decisions on hisown behalf. 6. Skin/Wound Care: Air mattress overlay. Eucerin cream to bilateral feet. Maintain adequate nutritional and hydration status.   7. Fluids/Electrolytes/Nutrition:   Strict NPO at this time.   Consult dietician to assist with tube feeds.   Changed to more elemental form, appears to have helped.   Inquiring about banana flakes  D/Ced IVF and increase water flushes. 8. Sacral decub Stage II: Has has a lot of muscle loss--added protein supplement to tube feeds. Added additional vitamins to help promote healing. WOC  9. Pseudomonas Aeruginosa UTI with Urosepsis: Has been afebrile on antibiotics.   D/ced meropenum, course completed.    Foley changed, as has been in 30 days.  10. Acute renal failure: Resolved.   Cr within acceptable limits 12/25 11. VDRF with tracheostomy: Case discussed with PCCM NP--to continue CFS # 6 cough/endurance improves.   Ordered chest vest as well as hypertonic saline nebs X 48 hours to help with mobilize mucous/atelectasis.  12. H/o A fib s/p DCCV: Eliquis restarted. Monitor HR qid.  13. Recent C diff colitis: treated. Monitor for recurrence with antibiotics on board again.  14. Dysphagia: Continue NPO status. Speech therapy for PMSV and trials of ice chips as indicated. 15. DM type 2:   Hgb A1c 6.4  Monitor BS  Lantus 10 BID, increased to 13 on 12/19, increased to 15U on 12/22, increased to 18U on 12/25  Will cont to monitor 16. Protein calorie Malnutrition: Increased rate of TF for until evaluated by RD.  17. Leukocytosis  WBCs 10.2 on 12/20  Cont to monitor 18. ABLA  Hb 11.0 on 12/20  Cont to monitor 19. Abnormal CXR  CXR 12/18 reviewed abnormalities, however U/S neg for effusion  Per pulm, less likely PNA  Appreciate Pulm recs, d/ced Meropenum, cont intermittent diuresis  Scheduled  Robitussin DM 20. Neurogenic bladder/bowel  Scheduled I/O cath  Cont bowel program  21. Hypoalbuminemia  Improving 1.9 on 12/20   Cont supplementation 22. Secondary HTN  Likely reactive, controlled 12/25    LOS (Days) 10 A FACE TO FACE EVALUATION WAS PERFORMED  Samuel Peck Karis Juba 07/04/2016 9:25 AM

## 2016-07-04 NOTE — Plan of Care (Signed)
Problem: SCI BOWEL ELIMINATION Goal: RH STG SCI MANAGE BOWEL WITH MEDICATION WITH ASSISTANCE STG SCI Manage bowel with medication with  Max assistance.   Outcome: Progressing Given suppository  Problem: SCI BLADDER ELIMINATION Goal: RH STG MANAGE BLADDER WITH MEDICATION WITH ASSISTANCE STG Manage Bladder With Medication With max Assistance. Currently has foley. Foley care every shift and PRN   Outcome: Not Progressing I+O cath q 6 HRs

## 2016-07-05 ENCOUNTER — Inpatient Hospital Stay (HOSPITAL_COMMUNITY): Payer: Medicare Other | Admitting: Occupational Therapy

## 2016-07-05 ENCOUNTER — Inpatient Hospital Stay (HOSPITAL_COMMUNITY): Payer: No Typology Code available for payment source | Admitting: Speech Pathology

## 2016-07-05 ENCOUNTER — Inpatient Hospital Stay (HOSPITAL_COMMUNITY): Payer: Medicare Other | Admitting: Physical Therapy

## 2016-07-05 ENCOUNTER — Inpatient Hospital Stay (HOSPITAL_COMMUNITY): Payer: No Typology Code available for payment source

## 2016-07-05 LAB — GLUCOSE, CAPILLARY
GLUCOSE-CAPILLARY: 196 mg/dL — AB (ref 65–99)
GLUCOSE-CAPILLARY: 200 mg/dL — AB (ref 65–99)
Glucose-Capillary: 200 mg/dL — ABNORMAL HIGH (ref 65–99)
Glucose-Capillary: 209 mg/dL — ABNORMAL HIGH (ref 65–99)
Glucose-Capillary: 217 mg/dL — ABNORMAL HIGH (ref 65–99)
Glucose-Capillary: 223 mg/dL — ABNORMAL HIGH (ref 65–99)

## 2016-07-05 MED ORDER — GUAIFENESIN-DM 100-10 MG/5ML PO SYRP
10.0000 mL | ORAL_SOLUTION | Freq: Three times a day (TID) | ORAL | Status: DC
  Administered 2016-07-05 – 2016-07-08 (×11): 10 mL
  Filled 2016-07-05 (×12): qty 10

## 2016-07-05 NOTE — Progress Notes (Addendum)
Speech Language Pathology Daily Session Notes  Patient Details  Name: Samuel LeventhalCharles Linzy MRN: [id number] Date of Birth: 1940/11/03  Today's Date: 07/05/2016  Session 1 SLP Individual Time: 0800-0830 SLP Individual Time Calculation (min): 30 min  Session 2 SLP Cancel: 30 minutes due to fatigue    Short Term Goals:Week 2: SLP Short Term Goal 1 (Week 2): Patient will donn/doff PMSV with Min A verbal cues.  SLP Short Term Goal 2 (Week 2): Patient will wear PMSV with full supervision with all vitals remaining WFL.  SLP Short Term Goal 3 (Week 2): Patient will perform pharyngeal strengthening exercises with Max A multimodal cues.  SLP Short Term Goal 4 (Week 2): Patient will perform diaphragmatic breathing exercises with Mod A multimodal cues.  SLP Short Term Goal 5 (Week 2): Pt will orally expectorate secretions in 575% of opportunities with Min A verbal cues.    Skilled Therapeutic Interventions: Session 1 Skilled treatment session focused on addressing speech and swallow goals. Upon SLP arrival patient easily awakened with verbal stimuli.  PMSV donned with SpO2 mostly ranging between 88-93 with intermittent removal for dropping lower during repositioning.  Patient unable to orally expectorate secretions due to reports of feeling like they were thick and stuck today.  SLP facilitated session with set- up and completion of oral care via suctioning with no impact on moveability of pharyngeal secretions.  Patient completed 10 hard effortful swallows x3 and reports feeling that he has improved; as a result, patient requesting PO trials.  Will consult MD.  Continue with current plan of care.   Session 2 Patient missed 30 minutes of skilled SLP therapy due to fatigue.  SLP attempted to awake patient with verbal stimuli; after verbal and tactile stimuli patient with fleeting arousal to decline session due to fatigue.  Continue with current plan of care.   Function:  Eating Eating Eating activity did  not occur: Safety/medical concerns               Cognition Comprehension Comprehension assist level: Understands basic 90% of the time/cues < 10% of the time  Expression Expression assistive device: Talk trach valve Expression assist level: Expresses basic 75 - 89% of the time/requires cueing 10 - 24% of the time. Needs helper to occlude trach/needs to repeat words.  Social Interaction Social Interaction assist level: Interacts appropriately 90% of the time - Needs monitoring or encouragement for participation or interaction.  Problem Solving Problem solving assist level: Solves basic 50 - 74% of the time/requires cueing 25 - 49% of the time  Memory Memory assist level: Recognizes or recalls 75 - 89% of the time/requires cueing 10 - 24% of the time    Pain Pain Assessment Pain Assessment: No/denies pain x1  Therapy/Group: Individual Therapy x1  Charlane FerrettiMelissa Jalicia Roszak, M.A., CCC-SLP [phone]  Arriah Wadle 07/05/2016, 12:17 PM

## 2016-07-05 NOTE — Plan of Care (Signed)
Problem: SCI BLADDER ELIMINATION Goal: RH STG MANAGE BLADDER WITH MEDICATION WITH ASSISTANCE STG Manage Bladder With Medication With max Assistance. Currently has foley. Foley care every shift and PRN   Outcome: Progressing Foley has been D/C'd.

## 2016-07-05 NOTE — Progress Notes (Signed)
Occupational Therapy Session Note  Patient Details  Name: Samuel LeventhalCharles Towell MRN: [id number] Date of Birth: 01/23/1941  Today's Date: 07/05/2016 OT Individual Time: 1115-1200 and 1300-1325 OT Individual Time Calculation (min): 45 min and 25 min    Short Term Goals:Week 2:  OT Short Term Goal 1 (Week 2): Pt will roll with mod A using leg loops during ADL task in order to decrease caregiver burden OT Short Term Goal 2 (Week 2): Pt will dress UB with set-up assist seated in chair OT Short Term Goal 3 (Week 2): Pt will thread B LEs into pants in circle sit position with mod A OT Short Term Goal 4 (Week 2): Pt will transfer via sliding board with max A +1 to reduce caregiver burden  Skilled Therapeutic Interventions/Progress Updates:    Session One: Pt seen for OT session focusing on ADL re-training and upright tolerance. Pt sitting up in w/c upon arrival, voicing increased fatigue, however agreeable to tx session. He completed grooming tasks seated at sink. With w/c in upright position, pt able to reach forward to manipulate sink controls. UB dressing completed in w/c with pt requiring VCs and set-up assist for managing shirt, assist to pull down in back as well. Pt very fatigued this session, requiring multiple rest breaks throughout grooming task.  He returned to chair via maximove at end of session, left in supine with all needs in reach and family present.  Pt on 6L supplemental O2 throughout session, O2 levels 85-90% with activity.   Session Two: Pt seen for OT session focusing on bed mobility and transfer. Pt in supine upon arrival, still with complaints of fatigue, however, agreeable to tx session. With leg loops donned, pt able to walk B LEs off EOB, assist required to position trunk upright from side lying position, chuck pad used to position hips. Pt sat EOB ~3-4 minutes, tolerated ~15 seconds static sitting EOB with B UE support with close supervision. Reclined, supported rest breaks required  during time EOB. +2 assist to return to supine. Pt left in supine at end of session, transport team present to take pt to off unit procedure.   Therapy Documentation Precautions:  Precautions Precautions: Fall, Other (comment) Precaution Comments: trach, PEG, T5 para Restrictions Weight Bearing Restrictions: No Pain: Pain Assessment Pain Assessment: No/denies pain ADL: ADL ADL Comments: see functional navigator  See Function Navigator for Current Functional Status.   Therapy/Group: Individual Therapy  [name], Josealberto Montalto C 07/05/2016, 7:05 AM

## 2016-07-05 NOTE — Progress Notes (Signed)
Physical Therapy Session Note  Patient Details  Name: [name] MRN: [id number] Date of Birth: [birth year]  Today's Date: 07/05/2016 PT Individual Time: 0853-0950 PT Individual Time Calculation (min): 57 min    Therapy Documentation Precautions:  Precautions Precautions: Fall, Other (comment) Precaution Comments: trach, PEG, T5 para Restrictions Weight Bearing Restrictions: No General:   Vital Signs: Therapy Vitals Pulse Rate: (!) 101 Resp: (!) 24 BP: (!) 112/44 Patient Position (if appropriate): Sitting Oxygen Therapy SpO2: 90 % O2 Device: Tracheostomy Collar O2 Flow Rate (L/min): 6 L/min FiO2 (%): 28 %    ASSESSMENT INTERVENTION: Pre-intervention BP 115/53HR 112SaO2 88% 5LO2 via trach collar, RR: 26 Sputum: copious amounts thick no odor, yellow. Cough: weak, wet, productive, spontaneous Auscultation in short sit (anterior fields):strong rhonchi heard throughout (posterior fields) Right/left upper lobes: strong rhonchi heard throughout Left lower lobe: strong rhonchi heard throughout  INTERVENTION: modified postural drainage, Left/rightside lying percussion 3x40mnutes bilaterally POST INTERVENTION:  BP: 114/52    HR 114O2 88% 5LO2 viatrach collarRR 28 Sputum: copious amounts thick no odor,yellow . COUGH: spontaneous, wet, weak, productive Auscultation in short sFJF:JKNIOCODbreath sounds overall however strong rhonchi throughout on expiration ASSESSMENT: Patient responded well to percussion with rest breaks and re-education needed throughout the session. Increased cough and secretion production this session. He would benefit from continued chest PT including percussion and vibration in order to improve ventilation and perfusion, as well as secretion mobilization and patient comfort in order to improve functional abilities.   Patient managing secretions has improved significantly.  Max assist for rolling to the right and left  Right sidelying to seated  edge of bed max assist.  Total assist bed to wheelchair slide board transfer.   Patient remained seated upright at end of session in tilt in space wheelchair with all needs met.      See Function Navigator for Current Functional Status.   Therapy/Group: Individual Therapy  WRetta Diones12/26/2017, 10:32 AM

## 2016-07-05 NOTE — Plan of Care (Signed)
Problem: RH PAIN MANAGEMENT Goal: RH STG PAIN MANAGED AT OR BELOW PT'S PAIN GOAL < 3 on a 0-10 pain scale  Outcome: Progressing Denies any pain or discomfort.

## 2016-07-05 NOTE — Progress Notes (Addendum)
[place] PHYSICAL MEDICINE & REHABILITATION     PROGRESS NOTE  Subjective/Complaints:  Pt seen laying in bed this Am.  He states his neck is sore, but otherwise does not have any complaints.   ROS: +neck sore. Denies CP, SOB, N/V/D.  Objective: Vital Signs: Blood pressure (!) 112/44, pulse (!) 115, temperature 98.2 F (36.8 C), temperature source Oral, resp. rate 18, height 6\' 2"  (1.88 m), weight 85 kg (187 lb 6.3 oz), SpO2 93 %. No results found. No results for input(s): WBC, HGB, HCT, PLT in the last 72 hours.  Recent Labs  07/04/16 0525  NA 138  K 4.2  CL 100*  GLUCOSE 140*  BUN 18  CREATININE <0.30*  CALCIUM 8.6*   CBG (last 3)   Recent Labs  07/04/16 2356 07/05/16 0350 07/05/16 0752  GLUCAP 203* 196* 200*    Wt Readings from Last 3 Encounters:  07/01/16 85 kg (187 lb 6.3 oz)    Physical Exam:  BP (!) 112/44   Pulse (!) 115   Temp 98.2 F (36.8 C) (Oral)   Resp 18   Ht 6\' 2"  (1.88 m)   Wt 85 kg (187 lb 6.3 oz)   SpO2 93%   BMI 24.06 kg/m  Constitutional: NAD. Vital signs reviewed.  HENT: Normocephalic. Atraumatic Eyes: EOMI. No discharge.  Neck: +trach.  Cardiovascular: RRR. No JVD. Respiratory: Effort normal. He has no wheezes. +Ronchi/upper respiratory sounds.  GI: Soft. Bowel sounds are normal.  Musculoskeletal: He exhibits no edema, no tenderness.  Neurological: Alert. Motor: B/l UE 4-/5 proximal to distal. B/l LE: 0/5 (persistent) Sensation absent to light touch b/l LE No increase in tone noted (unchanged).  Skin:  Stage II sacral decubitus, not examined Small DTI on 1st digit LLE, not examined today Psychiatric: Flat but cooperative  Assessment/Plan: 1. Functional deficits secondary to T5 fracture/SCI with paraplegia which require 3+ hours per day of interdisciplinary therapy in a comprehensive inpatient rehab setting. Physiatrist is providing close team supervision and 24 hour management of active medical problems listed  below. Physiatrist and rehab team continue to assess barriers to discharge/monitor patient progress toward functional and medical goals.  Function:  Bathing Bathing position   Position: Bed  Bathing parts Body parts bathed by patient: Right arm, Left arm, Chest, Abdomen Body parts bathed by helper: Right arm, Left arm, Front perineal area, Chest, Abdomen, Right upper leg, Buttocks, Left upper leg, Left lower leg, Right lower leg, Back  Bathing assist Assist Level: Touching or steadying assistance(Pt > 75%)      Upper Body Dressing/Undressing Upper body dressing   What is the patient wearing?: Pull over shirt/dress     Pull over shirt/dress - Perfomed by patient: Thread/unthread right sleeve, Thread/unthread left sleeve, Put head through opening Pull over shirt/dress - Perfomed by helper: Pull shirt over trunk        Upper body assist Assist Level: 2 helpers      Lower Body Dressing/Undressing Lower body dressing   What is the patient wearing?: Pants, Socks, Shoes, [employer] Hose     Pants- Performed by patient: Thread/unthread right pants leg Pants- Performed by helper: Thread/unthread left pants leg, Pull pants up/down       Socks - Performed by helper: Don/doff right sock, Don/doff left sock   Shoes - Performed by helper: Don/doff right shoe, Don/doff left shoe, Fasten right, Fasten left       TED Hose - Performed by helper: Don/doff right TED hose, Don/doff left TED hose  Lower body assist Assist for lower body dressing: 2 Helpers      Toileting Toileting Toileting activity did not occur: No continent bowel/bladder event        Toileting assist Assist level: Two helpers (per NT report)   Transfers Chair/bed transfer   Chair/bed transfer method: Lateral scoot Chair/bed transfer assist level: 2 helpers Chair/bed transfer assistive device: Sliding board, Armrests Mechanical lift: Maximove   Locomotion Ambulation Ambulation activity did not occur: Safety/medical  concerns         Wheelchair   Type: Motorized Max wheelchair distance: 150 Assist Level: Total assistance (Pt < 25%)  Cognition Comprehension Comprehension assist level: Understands basic 90% of the time/cues < 10% of the time  Expression Expression assist level: Expresses basic 75 - 89% of the time/requires cueing 10 - 24% of the time. Needs helper to occlude trach/needs to repeat words.  Social Interaction Social Interaction assist level: Interacts appropriately 90% of the time - Needs monitoring or encouragement for participation or interaction.  Problem Solving Problem solving assist level: Solves basic 50 - 74% of the time/requires cueing 25 - 49% of the time  Memory Memory assist level: Recognizes or recalls 75 - 89% of the time/requires cueing 10 - 24% of the time     Medical Problem List and Plan: 1. Functional and mobility deficitssecondary to T5 fracture/SCI with paraplegia. Multiple post- injury complications  Cont CIR  RLE PRAFO ordered 2. DVT Prophylaxis/Anticoagulation: Pharmaceutical:   Discussed with WF surgery, Eliquis resumed after discussion with pharmacy 3. Pain Management: tylenol, tramadol for more severe pain 4. Mood: team to provide ego support 5. Neuropsych: This patient is capable of making decisions on hisown behalf. 6. Skin/Wound Care: Air mattress overlay. Eucerin cream to bilateral feet. Maintain adequate nutritional and hydration status.   7. Fluids/Electrolytes/Nutrition:   Strict NPO at this time.   Consult dietician to assist with tube feeds.   Changed to more elemental form, appears to have helped.   Inquiring about banana flakes  D/Ced IVF and increase water flushes. 8. Sacral decub Stage II: Has has a lot of muscle loss--added protein supplement to tube feeds. Added additional vitamins to help promote healing. WOC  9. Pseudomonas Aeruginosa UTI with Urosepsis: Has been afebrile on antibiotics.   D/ced meropenum, course completed.    Foley changed, as has been in 30 days.  10. Acute renal failure: Resolved.   Cr within acceptable limits 12/25  Labs ordered for tommorrow 11. VDRF with tracheostomy: Case discussed with PCCM NP--to continue CFS # 6 cough/endurance improves.   Ordered chest vest as well as hypertonic saline nebs X 48 hours to help with mobilize mucous/atelectasis.  12. H/o A fib s/p DCCV: Eliquis restarted. Monitor HR qid.  13. Recent C diff colitis: treated. Monitor for recurrence with antibiotics on board again.  14. Dysphagia: Continue NPO status. Speech therapy for PMSV and trials of ice chips as indicated. 15. DM type 2:   Hgb A1c 6.4  Monitor BS  Lantus 10 BID, increased to 13 on 12/19, increased to 15U on 12/22, increased to 18U on 12/25  Will cont to monitor, will likely need further increase tomorrow 16. Protein calorie Malnutrition: Increased rate of TF for until evaluated by RD.  17. Leukocytosis  WBCs 10.2 on 12/20  Labs ordered for tomorrow  Cont to monitor 18. ABLA  Hb 11.0 on 12/20  Labs ordered for tomorrow  Cont to monitor 19. Abnormal CXR  CXR 12/18 reviewed abnormalities, however U/S neg  for effusion  Per pulm, less likely PNA  Appreciate Pulm recs, d/ced Meropenum, cont intermittent diuresis  Scheduled Robitussin DM  CXR ordered 12/26 20. Neurogenic bladder/bowel  Scheduled I/O cath  Cont bowel program  21. Hypoalbuminemia  Improving 1.9 on 12/20   Cont supplementation 22. Secondary HTN  Likely reactive, controlled 12/26    LOS (Days) 11 A FACE TO FACE EVALUATION WAS PERFORMED  [place] Karis Juba 07/05/2016 9:18 AM
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
## 2016-07-06 ENCOUNTER — Inpatient Hospital Stay (HOSPITAL_COMMUNITY): Payer: Medicare Other | Admitting: Physical Therapy

## 2016-07-06 ENCOUNTER — Inpatient Hospital Stay (HOSPITAL_COMMUNITY): Payer: Medicare Other | Admitting: Occupational Therapy

## 2016-07-06 ENCOUNTER — Inpatient Hospital Stay (HOSPITAL_COMMUNITY): Payer: No Typology Code available for payment source | Admitting: Speech Pathology

## 2016-07-06 ENCOUNTER — Inpatient Hospital Stay (HOSPITAL_COMMUNITY): Payer: No Typology Code available for payment source | Admitting: Physical Therapy

## 2016-07-06 DIAGNOSIS — R918 Other nonspecific abnormal finding of lung field: Secondary | ICD-10-CM

## 2016-07-06 DIAGNOSIS — D62 Acute posthemorrhagic anemia: Secondary | ICD-10-CM

## 2016-07-06 LAB — GLUCOSE, CAPILLARY
Glucose-Capillary: 145 mg/dL — ABNORMAL HIGH (ref 65–99)
Glucose-Capillary: 154 mg/dL — ABNORMAL HIGH (ref 65–99)
Glucose-Capillary: 152 mg/dL — ABNORMAL HIGH (ref 65–99)
Glucose-Capillary: 135 mg/dL — ABNORMAL HIGH (ref 65–99)
Glucose-Capillary: 153 mg/dL — ABNORMAL HIGH (ref 65–99)

## 2016-07-06 MED ORDER — IPRATROPIUM-ALBUTEROL 0.5-2.5 (3) MG/3ML IN SOLN
3.0000 mL | Freq: Four times a day (QID) | RESPIRATORY_TRACT | Status: DC
  Administered 2016-07-06 – 2016-07-08 (×9): 3 mL via RESPIRATORY_TRACT
  Filled 2016-07-06 (×8): qty 3

## 2016-07-06 NOTE — Progress Notes (Signed)
Speech Language Pathology Daily Session Note  Patient Details  Name: Samuel Peck MRN: [id number] Date of Birth: [birth year]  Today's Date: 07/06/2016 SLP Individual Time: 1400-1430 SLP Individual Time Calculation (min): 30 min   Short Term Goals: Week 2: SLP Short Term Goal 1 (Week 2): Patient will donn/doff PMSV with Min A verbal cues.  SLP Short Term Goal 2 (Week 2): Patient will wear PMSV with full supervision with all vitals remaining WFL.  SLP Short Term Goal 3 (Week 2): Patient will perform pharyngeal strengthening exercises with Max A multimodal cues.  SLP Short Term Goal 4 (Week 2): Patient will perform diaphragmatic breathing exercises with Mod A multimodal cues.  SLP Short Term Goal 5 (Week 2): Pt will orally expectorate secretions in 75% of opportunities with Min A verbal cues.    Skilled Therapeutic Interventions: Skilled treatment session focused on addressing speech and swallowing goals. Patient received after receiving chest PT and appeared lethargic.  PMSV donned with SpO2 mostly ranging between 88-92% with Mod verbal cues needed for use of an increased vocal intensity and diaphragmatic breathing to improve speech intelligibility at the phrase level to ~75% intelligible. Patient unable to orally expectorate secretions, suspect due to lethargy.  Patient completed 5 hard effortful swallows and utilized the incentive spirometer X 10 with Mod verbal cues. Patient missed remaining 30 minutes of session due to fatigue. PMSV was removed and patient left supine in bed with all needs within reach. Continue with current plan of care.    Function:   Cognition Comprehension Comprehension assist level: Understands basic 90% of the time/cues < 10% of the time  Expression Expression assistive device: Talk trach valve Expression assist level: Expresses basic 75 - 89% of the time/requires cueing 10 - 24% of the time. Needs helper to occlude trach/needs to repeat words.  Social  Interaction Social Interaction assist level: Interacts appropriately 90% of the time - Needs monitoring or encouragement for participation or interaction.  Problem Solving Problem solving assist level: Solves basic 50 - 74% of the time/requires cueing 25 - 49% of the time  Memory Memory assist level: Recognizes or recalls 75 - 89% of the time/requires cueing 10 - 24% of the time    Pain No/Denies Pain   Therapy/Group: Individual Therapy  Malike Foglio 07/06/2016, 3:37 PM
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
## 2016-07-06 NOTE — Progress Notes (Signed)
Speech Language Pathology Daily Session Note  Patient Details  Name: Samuel Peck MRN: [id number] Date of Birth: [birth year]  Today's Date: 07/06/2016 SLP Co-Treatment Time: 0945 (co-tx with PT(RV) 0930-1000)-1000 SLP Co-Treatment Time Calculation (min): 15 min   Short Term Goals: Week 2: SLP Short Term Goal 1 (Week 2): Patient will donn/doff PMSV with Min A verbal cues.  SLP Short Term Goal 2 (Week 2): Patient will wear PMSV with full supervision with all vitals remaining WFL.  SLP Short Term Goal 3 (Week 2): Patient will perform pharyngeal strengthening exercises with Max A multimodal cues.  SLP Short Term Goal 4 (Week 2): Patient will perform diaphragmatic breathing exercises with Mod A multimodal cues.  SLP Short Term Goal 5 (Week 2): Pt will orally expectorate secretions in 75% of opportunities with Min A verbal cues.    Skilled Therapeutic Interventions: Skilled co-treatment with PT focused on speech goals. Upon arrival, PT present with patient upright in tilt-in-space wheelchair and PMSV in place. PMSV remained in place throughout the session with all vitals remained WFL. PT postioned patient in a more upright position in the tilt-in-space wheelchair intermittently throughout the session with patient reporting that he felt "weak" with difficulty breathing during postioning. Patient utilized diaphragmatic breathing to focus on maximizing breath support while in an upright position with Min A verbal and visual cues with all vitals remaining WFL. Patient also verbalized at the phrase level with supervision verbal cues for increased vocal intensity. SLP also performed oral care via the suction toothbrush and performed minimal trials of pharyngeal strengthening exercises with Mod A verbal cues. Patient expectorated secretions in 100% of opportunitie with extra time. Patient left reclined in wheelchair with all needs within reach. Continue with current plan of care.    Function:  Cognition Comprehension Comprehension assist level: Understands basic 90% of the time/cues < 10% of the time  Expression Expression assistive device: Talk trach valve Expression assist level: Expresses basic 75 - 89% of the time/requires cueing 10 - 24% of the time. Needs helper to occlude trach/needs to repeat words.  Social Interaction Social Interaction assist level: Interacts appropriately 90% of the time - Needs monitoring or encouragement for participation or interaction.  Problem Solving Problem solving assist level: Solves basic 50 - 74% of the time/requires cueing 25 - 49% of the time  Memory Memory assist level: Recognizes or recalls 75 - 89% of the time/requires cueing 10 - 24% of the time    Pain Pain Assessment Pain Assessment: No/denies pain  Therapy/Group: Individual Therapy  Illyana Schorsch 07/06/2016, 11:54 AM

## 2016-07-06 NOTE — Progress Notes (Signed)
Physical Therapy Session Note  Patient Details  Name: Samuel Peck MRN: [id number] Date of Birth: 27-Mar-1941  Today's Date: 07/06/2016 PT Individual Time: 1300-1400 PT Individual Time Calculation (min): 60 min    Short Term Goals: Week 2:  PT Short Term Goal 1 (Week 2): Pt will perform bed mobility with use of leg loops and modA +1 PT Short Term Goal 2 (Week 2): Pt will perform transfers w/c <> bed/mat level surface with modA +1 PT Short Term Goal 3 (Week 2): Pt will demonstrate sitting tolerance of 5 min edge of mat with dynamic activity modA  PT Short Term Goal 4 (Week 2): Pt will tolerate sitting up in tilt in space w/c x 4 hours calling for boosting/pressure relief every 30 min with 50% reminder cues  Skilled Therapeutic Interventions/Progress Updates:      ASSESSMENT INTERVENTION:  Pre-intervention BP 97/84 HR 98 SaO2 92% 5LO2 via trach collar, RR: 26 Sputum: copious, thick pale yellow, no odor Cough: weak, wet, spontaneous, productive with verbal cues for huffing Auscultation in short sit: (anterior fields) rhonchi heard throughout; (posterior fields) Right/left upper lobes: rhonchi heard throughout, L>R; Left lower lobe: strong rhonchi heard throughout  INTERVENTION: modified postural drainage, Left/right side lying percussion 3x3 minutes bilaterally, followed by 3 trials of vibration with forceful expiration POST INTERVENTION:  BP: 116/51     HR 96 O2 90% 5LO2 via trach collar RR 28 Sputum: copious, thick pale yellow, no odor COUGH: spontaneous, wet, weak, productive Auscultation in short sit: Pt with improved breath sounds overall on inspiration and expiration, but strong rhonchi on expiration in L lower lobe  ASSESSMENT: Patient responded well to percussion and vibration with rest breaks and re-education needed throughout the session on huffing. Increased cough and secretion production this session. He would benefit from continued chest PT including percussion and vibration  in order to improve ventilation and perfusion, as well as secretion mobilization and patient comfort in order to improve functional abilities.   Patient managing secretions continues to improve.    Max assist for rolling to the right and left.  Handoff to SLP at end of session.     Therapy Documentation Precautions:  Precautions Precautions: Fall, Other (comment) Precaution Comments: trach, PEG, T5 para Restrictions Weight Bearing Restrictions: No  See Function Navigator for Current Functional Status.   Therapy/Group: Individual Therapy  Ladora DanielCaitlin E Penven-Crew 07/06/2016, 8:51 AM

## 2016-07-06 NOTE — Progress Notes (Signed)
Physical Therapy Session Note  Patient Details  Name: Samuel Peck MRN: [id number] Date of Birth: July 12, 1940  Today's Date: 07/06/2016 PT Individual Time: 0915-0930 PT Individual Time Calculation (min): 15 min  and Today's Date: 07/06/2016 PT Co-Treatment Time: 0930 (co-tx 930-10)-0945 PT Co-Treatment Time Calculation (min): 15 min   Short Term Goals: Week 2:  PT Short Term Goal 1 (Week 2): Pt will perform bed mobility with use of leg loops and modA +1 PT Short Term Goal 2 (Week 2): Pt will perform transfers w/c <> bed/mat level surface with modA +1 PT Short Term Goal 3 (Week 2): Pt will demonstrate sitting tolerance of 5 min edge of mat with dynamic activity modA  PT Short Term Goal 4 (Week 2): Pt will tolerate sitting up in tilt in space w/c x 4 hours calling for boosting/pressure relief every 30 min with 50% reminder cues  Skilled Therapeutic Interventions/Progress Updates:   Patient in reclined TIS wheelchair upon arrival with PMSV in place (SLP notified), reporting weakness "the past couple of days" and fatigue, agreeable to therapy. Trach collar not in place upon arrival and was readjusted to correct position. Performed UB dressing in wheelchair with setup assist for managing shirt and patient able to lean forward to allow therapist to pull shirt down in back. Remainder of session focused on skilled co-treat with SLP. To work on increasing upright tolerance, patient repositioned to more upright position in TIS wheelchair intermittently throughout session up to 2 min before complaining of weakness and difficulty breathing in this position. Instructed patient in diaphragmatic breathing with cues from SLP to maximize breath support with vitals [facility] and patient able to tolerate more upright position for much longer periods of time. Patient also able to reposition himself to midline in wheelchair with cues. Patient left in reclined TIS wheelchair with all needs within reach and respiratory  therapist present.     Therapy Documentation Precautions: Precautions Precautions: Fall, Other (comment) Precaution Comments: trach, PEG, T5 para Restrictions Weight Bearing Restrictions: No Vital Signs: Therapy Vitals Pulse Rate: (!) 106 Resp: 16 BP: (!) 112/45 Patient Position (if appropriate): Sitting Oxygen Therapy SpO2: 95 % O2 Device: Tracheostomy Collar O2 Flow Rate (L/min): 6 L/min FiO2 (%): 28 % Pain:  Denies pain  See Function Navigator for Current Functional Status.  Therapy/Group: Individual Therapy and Co-Treatment  Romond Pipkins, Prudencio PairRebecca A 07/06/2016, 7:44 AM

## 2016-07-06 NOTE — Progress Notes (Signed)
Occupational Therapy Session Note  Patient Details  Name: Samuel LeventhalCharles Peck MRN: [id number] Date of Birth: May 09, 1941  Today's Date: 07/06/2016 OT Individual Time: [id number] OT Individual Time Calculation (min): 67 min     Short Term Goals:Week 2:  OT Short Term Goal 1 (Week 2): Pt will roll with mod A using leg loops during ADL task in order to decrease caregiver burden OT Short Term Goal 2 (Week 2): Pt will dress UB with set-up assist seated in chair OT Short Term Goal 3 (Week 2): Pt will thread B LEs into pants in circle sit position with mod A OT Short Term Goal 4 (Week 2): Pt will transfer via sliding board with max A +1 to reduce caregiver burden  Skilled Therapeutic Interventions/Progress Updates:    Pt seen for OT session focusing on bed mobility and upright tolerance. Pt in supine upon arrival, agreeable to tx session. Pt desiring to shave this morning. Pants donned total A in supine with +2 assist to roll to pull pants. Up PT declined sliding board transfer, opting to use Maximove to transfer due to increased fatigue. Pt requiring deep suctioning by RN prior to mobility.  Pt able to recognize inproper sitting angle in chair, requiring assist to direct caregiver in how to reposition pt in chair.  Grooming tasks completed at sink, requiring to be tilted back in chair due to fatigue and "feeling weak". O2 states remained 77%-93% throughout session on 4 L supplemental O2. HRin 100s throughout session. Pt left tilted back in chair at end of session, encouraged pt to stay up in chair until conclusion of next session. Left with all needs in reach    Therapy Documentation Precautions:  Precautions Precautions: Fall, Other (comment) Precaution Comments: trach, PEG, T5 para Restrictions Weight Bearing Restrictions: No Pain:   No/ denies pain ADL: ADL ADL Comments: see functional navigator  See Function Navigator for Current Functional Status.   Therapy/Group: Individual  Therapy  [name], Andrell Tallman C 07/06/2016, 7:08 AM

## 2016-07-07 ENCOUNTER — Inpatient Hospital Stay (HOSPITAL_COMMUNITY): Payer: Medicare Other | Admitting: Physical Therapy

## 2016-07-07 ENCOUNTER — Inpatient Hospital Stay (HOSPITAL_COMMUNITY): Payer: No Typology Code available for payment source | Admitting: Occupational Therapy

## 2016-07-07 ENCOUNTER — Inpatient Hospital Stay (HOSPITAL_COMMUNITY): Payer: No Typology Code available for payment source | Admitting: Speech Pathology

## 2016-07-07 ENCOUNTER — Inpatient Hospital Stay (HOSPITAL_COMMUNITY): Payer: Medicare Other

## 2016-07-07 DIAGNOSIS — N319 Neuromuscular dysfunction of bladder, unspecified: Secondary | ICD-10-CM

## 2016-07-07 DIAGNOSIS — K592 Neurogenic bowel, not elsewhere classified: Secondary | ICD-10-CM

## 2016-07-07 DIAGNOSIS — A499 Bacterial infection, unspecified: Secondary | ICD-10-CM

## 2016-07-07 DIAGNOSIS — N390 Urinary tract infection, site not specified: Secondary | ICD-10-CM

## 2016-07-07 DIAGNOSIS — Z930 Tracheostomy status: Secondary | ICD-10-CM

## 2016-07-07 DIAGNOSIS — E119 Type 2 diabetes mellitus without complications: Secondary | ICD-10-CM

## 2016-07-07 DIAGNOSIS — I482 Chronic atrial fibrillation: Secondary | ICD-10-CM

## 2016-07-07 LAB — GLUCOSE, CAPILLARY
GLUCOSE-CAPILLARY: 150 mg/dL — AB (ref 65–99)
GLUCOSE-CAPILLARY: 160 mg/dL — AB (ref 65–99)
Glucose-Capillary: 180 mg/dL — ABNORMAL HIGH (ref 65–99)
Glucose-Capillary: 84 mg/dL (ref 65–99)
Glucose-Capillary: 159 mg/dL — ABNORMAL HIGH (ref 65–99)
Glucose-Capillary: 171 mg/dL — ABNORMAL HIGH (ref 65–99)

## 2016-07-07 LAB — CBC
HCT: 33.8 % — ABNORMAL LOW (ref 39.0–52.0)
HEMOGLOBIN: 9.9 g/dL — AB (ref 13.0–17.0)
MCH: 24.8 pg — AB (ref 26.0–34.0)
MCHC: 29.3 g/dL — ABNORMAL LOW (ref 30.0–36.0)
MCV: 84.5 fL (ref 78.0–100.0)
PLATELETS: 299 10*3/uL (ref 150–400)
RBC: 4 MIL/uL — AB (ref 4.22–5.81)
RDW: 17.9 % — ABNORMAL HIGH (ref 11.5–15.5)
WBC: 8.4 10*3/uL (ref 4.0–10.5)

## 2016-07-07 MED ORDER — FLUCONAZOLE 40 MG/ML PO SUSR
100.0000 mg | Freq: Every day | ORAL | Status: DC
  Administered 2016-07-07 – 2016-07-08 (×2): 100 mg
  Filled 2016-07-07 (×2): qty 2.5

## 2016-07-07 NOTE — Progress Notes (Signed)
Occupational Therapy Session Note  Patient Details  Name: Samuel LeventhalCharles Heater MRN: [id number] Date of Birth: 09-19-1940  Today's Date: 07/07/2016 OT Individual Time: 0700-0800 and 1400-1430 OT Individual Time Calculation (min): 60 min and 30 min    Short Term Goals:Week 2:  OT Short Term Goal 1 (Week 2): Pt will roll with mod A using leg loops during ADL task in order to decrease caregiver burden OT Short Term Goal 2 (Week 2): Pt will dress UB with set-up assist seated in chair OT Short Term Goal 3 (Week 2): Pt will thread B LEs into pants in circle sit position with mod A OT Short Term Goal 4 (Week 2): Pt will transfer via sliding board with max A +1 to reduce caregiver burden  Skilled Therapeutic Interventions/Progress Updates:    Session One: Pt seen for OT session focusing on ADL re-training. Pt in supine upon arrival, voicing need to be suctioned, RN made aware and suctioning performed before tx session. Addressed LB dressing goal with pt able to assist with threading LEs into pants with mod assist from therapist to position LEs. Rest breaks required throughout due to decreased activity tolerance. He rolled with mod A and able to assist with pulling pants up.  With leg loops donned, pt able to manage LEs to position self in figure 4 position and with steadying assist and set-up, pt able to don and fast B shoes. Pt's O2 levels ranging from 74%-92% throughout session on 5 L supplemental O2. HR 90s-100s.  Pt left in supine at end of session, all needs in reach and RN present.   Session Two: Pt seen for OT session focusing on bed mobility and sitting balance. Pt in supine upon arrival, cont with complaints of fatigue, however, willing to attempt therapy. With use of leg loops, pt able to manage B LEs off EOB and assist provided to bring trunk to upright position with use of chuck pad to position hips. Pt initially requiring total A for sitting balance. VCs provided for weightshift to find balance  point. Pt able to maintain static sitting balance with min A ~5 seconds before requiring max- total A to regain. Pt reported difficulty breathing with anterior weightshift required for sitting balance. O2 remained >88% throughout session on 5L supplemental O2. Pt tolerated ~10 minutes EOB before requesting return to supine. +2 to return to supine. Pt left with all needs in reach.   Therapy Documentation Precautions:  Precautions Precautions: Fall, Other (comment) Precaution Comments: trach, PEG, T5 para Restrictions Weight Bearing Restrictions: No Pain:   No/ denies pain ADL: ADL ADL Comments: see functional navigator  See Function Navigator for Current Functional Status.   Therapy/Group: Individual Therapy  [name], Kristian Coveymy C 07/07/2016, 6:38 AM

## 2016-07-07 NOTE — Progress Notes (Addendum)
[facility]     PROGRESS NOTE  Subjective/Complaints:  Still having secretions, yellow-green in container. Unable to produce such secretions for me. Denies being short of breath as long as no secretions are in airway.   ROS: pt denies nausea, vomiting, diarrhea, pain    Objective: Vital Signs: Blood pressure (!) 118/55, pulse 85, temperature 98.6 F (37 C), temperature source Axillary, resp. rate (!) 22, height 6\' 2"  (1.88 m), weight 85 kg (187 lb 6.3 oz), SpO2 98 %. Dg Chest 2 View  Result Date: 07/05/2016 CLINICAL DATA:  Increased sputum production EXAM: CHEST  2 VIEW COMPARISON:  06/29/2016 FINDINGS: Cardiac shadow is stable. Postoperative changes and tracheostomy tube are again seen and stable. The bibasilar infiltrates seen on the prior exam are stable in appearance. No new focal infiltrate is noted. No acute bony abnormality is seen. IMPRESSION: Stable bibasilar infiltrates. Electronically Signed   By: Alcide CleverMark  Lukens M.D.   On: 07/05/2016 14:07    Recent Labs  07/07/16 0535  WBC 8.4  HGB 9.9*  HCT 33.8*  PLT 299   No results for input(s): NA, K, CL, GLUCOSE, BUN, CREATININE, CALCIUM in the last 72 hours.  Invalid input(s): CO CBG (last 3)   Recent Labs  07/07/16 0004 07/07/16 0409 07/07/16 0749  GLUCAP 150* 180* 160*    Wt Readings from Last 3 Encounters:  07/01/16 85 kg (187 lb 6.3 oz)    Physical Exam:  BP (!) 118/55 (BP Location: Left Arm)   Pulse 85   Temp 98.6 F (37 C) (Axillary)   Resp (!) 22   Ht 6\' 2"  (1.88 m)   Wt 85 kg (187 lb 6.3 oz)   SpO2 98%   BMI 24.06 kg/m  Constitutional: NAD. Vital signs reviewed.  HENT: Normocephalic. Atraumatic, ?white coating over tongue/roof of mouth Eyes: EOMI. No discharge.  Neck: +trach, #6 with PMV  Cardiovascular: RRR. Respiratory: still with scattered rhonchi, weak cough.  Has difficulty bringing up secretions. GI: Soft. Bowel sounds are normal.  Musculoskeletal: He  exhibits no edema, no tenderness.  Neurological: Alert. Motor: B/l UE 4-/5 proximal to distal. B/l LE: 0/5 (stable) Sensation absent to light touch b/l LE No resting tone.  Skin:  Stage II sacral decubitus Small DTI on 1st digit LLE, not examined today Psychiatric: Flat   Assessment/Plan: 1. Functional deficits secondary to T5 fracture/SCI with paraplegia which require 3+ hours per day of interdisciplinary therapy in a comprehensive inpatient rehab setting. Physiatrist is providing close team supervision and 24 hour management of active medical problems listed below. Physiatrist and rehab team continue to assess barriers to discharge/monitor patient progress toward functional and medical goals.  Function:  Bathing Bathing position   Position: Bed  Bathing parts Body parts bathed by patient: Right arm, Left arm, Chest, Abdomen Body parts bathed by helper: Right arm, Left arm, Front perineal area, Chest, Abdomen, Right upper leg, Buttocks, Left upper leg, Left lower leg, Right lower leg, Back  Bathing assist Assist Level: Touching or steadying assistance(Pt > 75%)      Upper Body Dressing/Undressing Upper body dressing   What is the patient wearing?: Pull over shirt/dress     Pull over shirt/dress - Perfomed by patient: Thread/unthread right sleeve, Thread/unthread left sleeve, Put head through opening Pull over shirt/dress - Perfomed by helper: Pull shirt over trunk        Upper body assist Assist Level: 2 helpers      Lower Body Dressing/Undressing Lower body  dressing   What is the patient wearing?: Pants, Socks, Shoes, [employer]- Performed by patient: Thread/unthread right pants leg, Thread/unthread left pants leg Pants- Performed by helper: Pull pants up/down       Socks - Performed by helper: Don/doff right sock, Don/doff left sock Shoes - Performed by patient: Don/doff right shoe, Don/doff left shoe, Fasten right, Fasten left Shoes - Performed by  helper: Don/doff right shoe, Don/doff left shoe, Fasten right, Fasten left       TED Hose - Performed by helper: Don/doff right TED hose, Don/doff left TED hose  Lower body assist Assist for lower body dressing: Touching or steadying assistance (Pt > 75%)      Toileting Toileting Toileting activity did not occur: No continent bowel/bladder event        Toileting assist Assist level: Two helpers (per NT report)   Transfers Chair/bed transfer   Chair/bed transfer method: Lateral scoot Chair/bed transfer assist level: 2 helpers Chair/bed transfer assistive device: Sliding board, Armrests Mechanical lift: Maximove   Locomotion Ambulation Ambulation activity did not occur: Safety/medical concerns         Wheelchair   Type: Motorized Max wheelchair distance: 150 Assist Level: Total assistance (Pt < 25%)  Cognition Comprehension Comprehension assist level: Understands complex 90% of the time/cues 10% of the time  Expression Expression assist level: Expresses basic 75 - 89% of the time/requires cueing 10 - 24% of the time. Needs helper to occlude trach/needs to repeat words.  Social Interaction Social Interaction assist level: Interacts appropriately 90% of the time - Needs monitoring or encouragement for participation or interaction.  Problem Solving Problem solving assist level: Solves basic 50 - 74% of the time/requires cueing 25 - 49% of the time  Memory Memory assist level: Recognizes or recalls 75 - 89% of the time/requires cueing 10 - 24% of the time     Medical Problem List and Plan: 1. Functional and mobility deficitssecondary to T5 fracture/SCI with paraplegia. Multiple post- injury complications  Cont CIR  RLE PRAFO  2. DVT Prophylaxis/Anticoagulation: :   Discussed with [facility]F surgery, Eliquis resumed after discussion with pharmacy 3. Pain Management: tylenol, tramadol for more severe pain 4. Mood: team to provide ego support 5. Neuropsych: This patient is capable  of making decisions on hisown behalf. 6. Skin/Wound Care: Air mattress overlay. Eucerin cream to bilateral feet. Maintain adequate nutritional and hydration status.   7. Fluids/Electrolytes/Nutrition:   Strict NPO at this time.   Elemental formula to assist with tolerance of TF   Increased water flushes. 8. Sacral decub Stage II: Has has a lot of muscle loss--added protein supplement to tube feeds. Added additional vitamins to help promote healing. WOC  9. Pseudomonas Aeruginosa UTI with Urosepsis: Has been afebrile off antibiotics.   Has completed meropenum    Foley changed, as has been in 30 days.  10. Acute renal failure: Resolved.   Cr within acceptable limits 12/25    11. VDRF with tracheostomy: Case discussed with PCCM NP--to continue CFS # 6 cough/endurance improves.   -chest PT/pulmonary toilet  -IS/FV/OOB 12. H/o A fib s/p DCCV: Eliquis restarted. Monitor HR qid.  13. Recent C diff colitis: treated.   -contact precautions.  14. Dysphagia: Continue NPO status. Speech therapy for PMSV and trials of ice chips as indicated. 15. DM type 2:   Hgb A1c 6.4  Monitor BS  Lantus 10 BID, increased to 13 on 12/19, increased to 15U on 12/22, increased to  18U on 12/25  -improved overall. No changes 16. Protein calorie Malnutrition: continue TF/ per RD  17. Leukocytosis  WBCs down to 8.4 today     Cont to monitor 18. ABLA  Hb 9.9 today    Cont to monitor 19. Respiratory failure/trach:  -#6trach. Tolerating PMV  CXR reviewed today with stable bi-basilar infiltrates  Per pulm, less likely PNA, remains afebrile, wbc's down  continue intermittent diuresis with lasix  -robitussin DM QID  -continue chest PT,FV,IS  -white debris on tongue/palate--?thrush---add diflucan for 3 days to cover  -still struggling quite a bit with clearing secretions, yellow-greenish in color  -don't know that it's worth while sending sputum for sample  -will ask pulmonary to follow up for any further  recs 20. Neurogenic bladder/bowel  Scheduled I/O cath  Cont bowel program  21. Hypoalbuminemia  Improving 1.9 on 12/20   Cont supplementation 22. Secondary HTN  Likely reactive, controlled 12/26    LOS (Days) 13 A FACE TO FACE EVALUATION WAS PERFORMED  Almena Hokenson T 07/07/2016 9:41 AM

## 2016-07-07 NOTE — Progress Notes (Signed)
Physical Therapy Session Note  Patient Details  Name: Samuel Peck MRN: [id number] Date of Birth: 05-10-1941  Today's Date: 07/07/2016 PT Individual Time: 1100-1200 PT Individual Time Calculation (min): 60 min    Short Term Goals: Week 2:  PT Short Term Goal 1 (Week 2): Pt will perform bed mobility with use of leg loops and modA +1 PT Short Term Goal 2 (Week 2): Pt will perform transfers w/c <> bed/mat level surface with modA +1 PT Short Term Goal 3 (Week 2): Pt will demonstrate sitting tolerance of 5 min edge of mat with dynamic activity modA  PT Short Term Goal 4 (Week 2): Pt will tolerate sitting up in tilt in space w/c x 4 hours calling for boosting/pressure relief every 30 min with 50% reminder cues  Skilled Therapeutic Interventions/Progress Updates:    ASSESSMENT INTERVENTION:  Pre-intervention BP 121/56 HR 89 SaO2 88% 5LO2 via trach collar, RR: 26 Sputum: copious, thick pale yellow, no odor Cough: weak, wet, spontaneous, productive with verbal cues for huffing Auscultation in side lying: (anterior fields) mild rhonchi, though improved from yesterday; (posterior fields) Right lobes: mild rhonchi throughout; Left lobes: clear upper lobe on inspiration/expiration, lower lobe crackles   INTERVENTION: modified postural drainage, L/R side lying for percussion x3 minutes each lobe except 2x2 minutes for L lower lobe, vibrations to all lobes x3-5 reps with cues for forceful and extended exhale "ha" POST INTERVENTION:  BP: 116/51     HR 90 O2 89% 5LO2 via trach collar RR 26 Sputum: copious, thick pale yellow, no odor COUGH: spontaneous, wet, weak, productive x4 throughout session with cues for "ha," and forceful exhale Auscultation side lying: Pt with improved breath sounds overall on inspiration and expiration; crackles continued in L lower lobe though mildly improved on inhale   ASSESSMENT: Patient responded well to percussion and vibration with rest breaks and re-education needed  throughout the session on huffing. Increased cough and secretion production this session with PMSV donned (approved by SLP) and pt was able to expectorate secretions. He would benefit from continued chest PT including percussion and vibration in order to improve ventilation and perfusion, as well as secretion mobilization and patient comfort in order to improve functional abilities.    Patient managing secretions continues to improve.    Max assist for rolling to the right and left.  Max assist for supine>sit and total assist for slide board transfer to L to tilt in space chair.  Pt positioned tilted back in w/c with call bell in reach and needs met.    Therapy Documentation Precautions:  Precautions Precautions: Fall, Other (comment) Precaution Comments: trach, PEG, T5 para Restrictions Weight Bearing Restrictions: No   See Function Navigator for Current Functional Status.   Therapy/Group: Individual Therapy  Azharia Surratt E Penven-Crew 07/07/2016, 11:10 AM

## 2016-07-07 NOTE — Progress Notes (Signed)
Speech Language Pathology Daily Session Note  Patient Details  Name: Samuel LeventhalCharles Peck MRN: [id number] Date of Birth: 1941-04-09  Today's Date: 07/07/2016 SLP Individual Time: 0800-0830 SLP Individual Time Calculation (min): 30 min   Short Term Goals: Week 2: SLP Short Term Goal 1 (Week 2): Patient will donn/doff PMSV with Min A verbal cues.  SLP Short Term Goal 2 (Week 2): Patient will wear PMSV with full supervision with all vitals remaining WFL.  SLP Short Term Goal 3 (Week 2): Patient will perform pharyngeal strengthening exercises with Max A multimodal cues.  SLP Short Term Goal 4 (Week 2): Patient will perform diaphragmatic breathing exercises with Mod A multimodal cues.  SLP Short Term Goal 5 (Week 2): Pt will orally expectorate secretions in 75% of opportunities with Min A verbal cues.    Skilled Therapeutic Interventions: Skilled treatment session focused on speech and dysphagia goals. Upon arrival, patient's PMSV was already donned and remained in place throughout session with all vitals remaining WFL and no reports of intolerance from patient. SLP facilitated session by providing Max A verbal and visual cues for recall and problem solving during education in regards to donning and doffing PMSV. Patient consistently trying to doff PMSV without stabilizing the trach hub despite Max A multimodal cues. Recommend continued full supervision with PMSV and education in regards to donning/doffing valve. Patient able to orally expectorate secretions with extra time and Max A verbal cues for use of diaphragmatic breathing. Patient with use of an effortful swallow X 3 throughout session. Patient left supine in bed with PMSV removed and all needs within reach. Continue with current plan of care.   Function:  Eating Eating                 Cognition Comprehension Comprehension assist level: Understands complex 90% of the time/cues 10% of the time  Expression Expression assistive device: Talk  trach valve Expression assist level: Expresses basic 75 - 89% of the time/requires cueing 10 - 24% of the time. Needs helper to occlude trach/needs to repeat words.  Social Interaction Social Interaction assist level: Interacts appropriately 90% of the time - Needs monitoring or encouragement for participation or interaction.  Problem Solving Problem solving assist level: Solves basic 50 - 74% of the time/requires cueing 25 - 49% of the time  Memory Memory assist level: Recognizes or recalls 75 - 89% of the time/requires cueing 10 - 24% of the time    Pain Pain Assessment Pain Assessment: No/denies pain Pain Score: 0-No pain  Therapy/Group: Individual Therapy  Murray Durrell 07/07/2016, 11:34 AM

## 2016-07-07 NOTE — Progress Notes (Signed)
Physical Therapy Session Note  Patient Details  Name: Samuel Peck MRN: [id number] Date of Birth: 1940-09-05  Today's Date: 07/07/2016 PT Individual Time: 0918-0958 PT Individual Time Calculation (min): 40 min    Short Term Goals: Week 2:  PT Short Term Goal 1 (Week 2): Pt will perform bed mobility with use of leg loops and modA +1 PT Short Term Goal 2 (Week 2): Pt will perform transfers w/c <> bed/mat level surface with modA +1 PT Short Term Goal 3 (Week 2): Pt will demonstrate sitting tolerance of 5 min edge of mat with dynamic activity modA  PT Short Term Goal 4 (Week 2): Pt will tolerate sitting up in tilt in space w/c x 4 hours calling for boosting/pressure relief every 30 min with 50% reminder cues  Skilled Therapeutic Interventions/Progress Updates:       Received while supine in bed with respiratory therapy. PT returned in 15 minutes following respiratory treatment. And patient agreeable to PT. Session focused on sitting tolerance, sitting balance, and bed mobility. +2 provided throughout PT treatment from PT Clinical Specialist.   Bed mobility with max assist +2 to come to sitting EOB from Supine with patient to initiate BLE management with leg loops. Mod-max cues for sequencing and technique as well as proper UE support on bed once LE positioned.    Sitting tolerace EOB while completing balance tasks with R and L ipsilateral reaches lateral reaches 2 x 6 BUE up to 7 minutes before requiring rest break. Mod-max assist for sitting balance EOB with moderate cues for improve anterior weight shift to find balance point. PT also instructed pt in lateral scoot with max assist +2 for safety and max cues for sequencing, safety, and improved anterior weight shift.   Patient returned to Supine with max assist +2 to manage BLE and trunk. Rolling R and left in bed to position chuck pad. With max assist for LE positioning and increased pelvic rotation.   Patient left supine in bed with all  needs met.    Therapy Documentation Precautions: Precautions Precautions: Fall, Other (comment) Precaution Comments: trach, PEG, T5 para Restrictions Weight Bearing Restrictions: No General:   Missed time: 20 minutes ( with respiratory therapy)  Vital Signs: Therapy Vitals Temp: 98.6 F (37 C) Temp Source: Axillary Pulse Rate: 92 Resp: 18 BP: (!) 118/55 Patient Position (if appropriate): Sitting Oxygen Therapy SpO2: 94 % O2 Device: Tracheostomy Collar O2 Flow Rate (L/min): 6 L/min FiO2 (%): 30 %   See Function Navigator for Current Functional Status.   Therapy/Group: Individual Therapy  Lorie Phenix 07/07/2016, 7:57 AM

## 2016-07-08 ENCOUNTER — Inpatient Hospital Stay (HOSPITAL_COMMUNITY): Payer: Medicare Other | Admitting: Physical Therapy

## 2016-07-08 ENCOUNTER — Inpatient Hospital Stay (HOSPITAL_COMMUNITY): Payer: Medicare Other | Admitting: Occupational Therapy

## 2016-07-08 ENCOUNTER — Inpatient Hospital Stay (HOSPITAL_COMMUNITY): Payer: No Typology Code available for payment source

## 2016-07-08 ENCOUNTER — Inpatient Hospital Stay (HOSPITAL_COMMUNITY): Payer: Medicare Other | Admitting: Speech Pathology

## 2016-07-08 ENCOUNTER — Inpatient Hospital Stay (HOSPITAL_COMMUNITY)
Admission: AD | Admit: 2016-07-08 | Discharge: 2016-07-22 | DRG: 871 | Disposition: A | Discharge status: Other Acute Inpatient Hospital | Payer: Medicare Other | Source: Ambulatory Visit | Attending: Internal Medicine | Admitting: Internal Medicine

## 2016-07-08 DIAGNOSIS — J189 Pneumonia, unspecified organism: Secondary | ICD-10-CM | POA: Diagnosis present

## 2016-07-08 DIAGNOSIS — J156 Pneumonia due to other aerobic Gram-negative bacteria: Secondary | ICD-10-CM | POA: Diagnosis present

## 2016-07-08 DIAGNOSIS — Z930 Tracheostomy status: Secondary | ICD-10-CM | POA: Diagnosis not present

## 2016-07-08 DIAGNOSIS — E872 Acidosis: Secondary | ICD-10-CM | POA: Diagnosis present

## 2016-07-08 DIAGNOSIS — E559 Vitamin D deficiency, unspecified: Secondary | ICD-10-CM | POA: Diagnosis present

## 2016-07-08 DIAGNOSIS — A4102 Sepsis due to Methicillin resistant Staphylococcus aureus: Secondary | ICD-10-CM | POA: Diagnosis present

## 2016-07-08 DIAGNOSIS — J90 Pleural effusion, not elsewhere classified: Secondary | ICD-10-CM | POA: Diagnosis present

## 2016-07-08 DIAGNOSIS — Y95 Nosocomial condition: Secondary | ICD-10-CM | POA: Diagnosis present

## 2016-07-08 DIAGNOSIS — G8222 Paraplegia, incomplete: Secondary | ICD-10-CM | POA: Diagnosis present

## 2016-07-08 DIAGNOSIS — E870 Hyperosmolality and hypernatremia: Secondary | ICD-10-CM | POA: Diagnosis not present

## 2016-07-08 DIAGNOSIS — I482 Chronic atrial fibrillation: Secondary | ICD-10-CM | POA: Diagnosis present

## 2016-07-08 DIAGNOSIS — J9620 Acute and chronic respiratory failure, unspecified whether with hypoxia or hypercapnia: Secondary | ICD-10-CM

## 2016-07-08 DIAGNOSIS — J15212 Pneumonia due to Methicillin resistant Staphylococcus aureus: Secondary | ICD-10-CM | POA: Diagnosis present

## 2016-07-08 DIAGNOSIS — R6520 Severe sepsis without septic shock: Secondary | ICD-10-CM | POA: Diagnosis present

## 2016-07-08 DIAGNOSIS — L89153 Pressure ulcer of sacral region, stage 3: Secondary | ICD-10-CM

## 2016-07-08 DIAGNOSIS — L98419 Non-pressure chronic ulcer of buttock with unspecified severity: Secondary | ICD-10-CM | POA: Diagnosis present

## 2016-07-08 DIAGNOSIS — F0280 Dementia in other diseases classified elsewhere without behavioral disturbance: Secondary | ICD-10-CM | POA: Diagnosis present

## 2016-07-08 DIAGNOSIS — E876 Hypokalemia: Secondary | ICD-10-CM | POA: Diagnosis present

## 2016-07-08 DIAGNOSIS — J9621 Acute and chronic respiratory failure with hypoxia: Secondary | ICD-10-CM | POA: Diagnosis present

## 2016-07-08 DIAGNOSIS — G300 Alzheimer's disease with early onset: Secondary | ICD-10-CM | POA: Diagnosis present

## 2016-07-08 DIAGNOSIS — J690 Pneumonitis due to inhalation of food and vomit: Secondary | ICD-10-CM

## 2016-07-08 DIAGNOSIS — B370 Candidal stomatitis: Secondary | ICD-10-CM | POA: Diagnosis not present

## 2016-07-08 DIAGNOSIS — Z79899 Other long term (current) drug therapy: Secondary | ICD-10-CM

## 2016-07-08 DIAGNOSIS — L89152 Pressure ulcer of sacral region, stage 2: Secondary | ICD-10-CM | POA: Diagnosis present

## 2016-07-08 DIAGNOSIS — A0471 Enterocolitis due to Clostridium difficile, recurrent: Secondary | ICD-10-CM | POA: Diagnosis present

## 2016-07-08 DIAGNOSIS — Z888 Allergy status to other drugs, medicaments and biological substances status: Secondary | ICD-10-CM

## 2016-07-08 DIAGNOSIS — E43 Unspecified severe protein-calorie malnutrition: Secondary | ICD-10-CM | POA: Diagnosis present

## 2016-07-08 DIAGNOSIS — E118 Type 2 diabetes mellitus with unspecified complications: Secondary | ICD-10-CM | POA: Diagnosis present

## 2016-07-08 DIAGNOSIS — L304 Erythema intertrigo: Secondary | ICD-10-CM | POA: Diagnosis not present

## 2016-07-08 DIAGNOSIS — N319 Neuromuscular dysfunction of bladder, unspecified: Secondary | ICD-10-CM | POA: Diagnosis present

## 2016-07-08 DIAGNOSIS — E440 Moderate protein-calorie malnutrition: Secondary | ICD-10-CM | POA: Diagnosis present

## 2016-07-08 DIAGNOSIS — E119 Type 2 diabetes mellitus without complications: Secondary | ICD-10-CM | POA: Diagnosis not present

## 2016-07-08 DIAGNOSIS — E785 Hyperlipidemia, unspecified: Secondary | ICD-10-CM | POA: Diagnosis present

## 2016-07-08 DIAGNOSIS — Z9911 Dependence on respirator [ventilator] status: Secondary | ICD-10-CM

## 2016-07-08 DIAGNOSIS — Z87820 Personal history of traumatic brain injury: Secondary | ICD-10-CM

## 2016-07-08 DIAGNOSIS — A0472 Enterocolitis due to Clostridium difficile, not specified as recurrent: Secondary | ICD-10-CM | POA: Diagnosis not present

## 2016-07-08 DIAGNOSIS — J9690 Respiratory failure, unspecified, unspecified whether with hypoxia or hypercapnia: Secondary | ICD-10-CM

## 2016-07-08 DIAGNOSIS — I10 Essential (primary) hypertension: Secondary | ICD-10-CM | POA: Diagnosis present

## 2016-07-08 DIAGNOSIS — I4821 Permanent atrial fibrillation: Secondary | ICD-10-CM

## 2016-07-08 DIAGNOSIS — S24102S Unspecified injury at T2-T6 level of thoracic spinal cord, sequela: Secondary | ICD-10-CM | POA: Diagnosis not present

## 2016-07-08 DIAGNOSIS — B372 Candidiasis of skin and nail: Secondary | ICD-10-CM | POA: Diagnosis not present

## 2016-07-08 DIAGNOSIS — Z7901 Long term (current) use of anticoagulants: Secondary | ICD-10-CM

## 2016-07-08 DIAGNOSIS — D638 Anemia in other chronic diseases classified elsewhere: Secondary | ICD-10-CM | POA: Diagnosis present

## 2016-07-08 DIAGNOSIS — A419 Sepsis, unspecified organism: Secondary | ICD-10-CM

## 2016-07-08 LAB — URINALYSIS, ROUTINE W REFLEX MICROSCOPIC
Bilirubin Urine: NEGATIVE
Glucose, UA: NEGATIVE mg/dL
Hgb urine dipstick: NEGATIVE
Ketones, ur: NEGATIVE mg/dL
Nitrite: NEGATIVE
PROTEIN: 30 mg/dL — AB
SPECIFIC GRAVITY, URINE: 1.027 (ref 1.005–1.030)
SQUAMOUS EPITHELIAL / LPF: NONE SEEN
pH: 5 (ref 5.0–8.0)

## 2016-07-08 LAB — BASIC METABOLIC PANEL
Anion gap: 10 (ref 5–15)
BUN: 19 mg/dL (ref 6–20)
CHLORIDE: 101 mmol/L (ref 101–111)
CO2: 28 mmol/L (ref 22–32)
CREATININE: 0.39 mg/dL — AB (ref 0.61–1.24)
Calcium: 8.6 mg/dL — ABNORMAL LOW (ref 8.9–10.3)
GFR calc Af Amer: >60 mL/min (ref >60)
GFR calc non Af Amer: >60 mL/min (ref >60)
Glucose, Bld: 181 mg/dL — ABNORMAL HIGH (ref 65–99)
Potassium: 4.1 mmol/L (ref 3.5–5.1)
SODIUM: 139 mmol/L (ref 135–145)

## 2016-07-08 LAB — MRSA PCR SCREENING: MRSA by PCR: NEGATIVE

## 2016-07-08 LAB — CBC
HEMATOCRIT: 35.1 % — AB (ref 39.0–52.0)
HEMOGLOBIN: 10.4 g/dL — AB (ref 13.0–17.0)
MCH: 25.5 pg — AB (ref 26.0–34.0)
MCHC: 29.6 g/dL — AB (ref 30.0–36.0)
MCV: 86 fL (ref 78.0–100.0)
Platelets: 291 10*3/uL (ref 150–400)
RBC: 4.08 MIL/uL — ABNORMAL LOW (ref 4.22–5.81)
RDW: 18.3 % — ABNORMAL HIGH (ref 11.5–15.5)
WBC: 17.4 10*3/uL — ABNORMAL HIGH (ref 4.0–10.5)

## 2016-07-08 LAB — STREP PNEUMONIAE URINARY ANTIGEN: Strep Pneumo Urinary Antigen: NEGATIVE

## 2016-07-08 LAB — EXPECTORATED SPUTUM ASSESSMENT W GRAM STAIN, RFLX TO RESP C

## 2016-07-08 LAB — GLUCOSE, CAPILLARY
GLUCOSE-CAPILLARY: 166 mg/dL — AB (ref 65–99)
GLUCOSE-CAPILLARY: 179 mg/dL — AB (ref 65–99)
GLUCOSE-CAPILLARY: 253 mg/dL — AB (ref 65–99)
Glucose-Capillary: 181 mg/dL — ABNORMAL HIGH (ref 65–99)

## 2016-07-08 LAB — PROCALCITONIN: Procalcitonin: 0.4 ng/mL

## 2016-07-08 MED ORDER — ATORVASTATIN CALCIUM 10 MG PO TABS
10.0000 mg | ORAL_TABLET | Freq: Every day | ORAL | Status: DC
  Administered 2016-07-08 – 2016-07-21 (×14): 10 mg
  Filled 2016-07-08 (×14): qty 1

## 2016-07-08 MED ORDER — FAMOTIDINE 20 MG PO TABS
20.0000 mg | ORAL_TABLET | Freq: Two times a day (BID) | ORAL | Status: DC
  Administered 2016-07-08 – 2016-07-09 (×3): 20 mg via ORAL
  Filled 2016-07-08 (×3): qty 1

## 2016-07-08 MED ORDER — HEPARIN SODIUM (PORCINE) 5000 UNIT/ML IJ SOLN: 5000.0000 [IU] | Freq: Three times a day (TID) | INTRAMUSCULAR | Status: DC

## 2016-07-08 MED ORDER — OXYCODONE HCL 5 MG PO TABS
5.0000 mg | ORAL_TABLET | Freq: Four times a day (QID) | ORAL | Status: DC | PRN
  Administered 2016-07-08 – 2016-07-19 (×5): 5 mg via ORAL
  Filled 2016-07-08 (×6): qty 1

## 2016-07-08 MED ORDER — INSULIN ASPART 100 UNIT/ML ~~LOC~~ SOLN
3.0000 [IU] | SUBCUTANEOUS | Status: DC
  Administered 2016-07-08 – 2016-07-22 (×73): 3 [IU] via SUBCUTANEOUS

## 2016-07-08 MED ORDER — ALBUTEROL SULFATE (2.5 MG/3ML) 0.083% IN NEBU
2.5000 mg | INHALATION_SOLUTION | RESPIRATORY_TRACT | Status: DC | PRN
  Administered 2016-07-17: 2.5 mg via RESPIRATORY_TRACT
  Filled 2016-07-08: qty 3

## 2016-07-08 MED ORDER — ORAL CARE MOUTH RINSE
15.0000 mL | OROMUCOSAL | Status: DC
  Administered 2016-07-09 – 2016-07-18 (×95): 15 mL via OROMUCOSAL

## 2016-07-08 MED ORDER — SODIUM CHLORIDE 0.9 % IV SOLN
INTRAVENOUS | Status: DC
  Administered 2016-07-08: 17:00:00 via INTRAVENOUS

## 2016-07-08 MED ORDER — MEMANTINE HCL 10 MG PO TABS
10.0000 mg | ORAL_TABLET | Freq: Two times a day (BID) | ORAL | Status: DC
  Administered 2016-07-08 – 2016-07-22 (×28): 10 mg
  Filled 2016-07-08 (×30): qty 1

## 2016-07-08 MED ORDER — SODIUM CHLORIDE 0.9 % IV SOLN
250.0000 mL | INTRAVENOUS | Status: DC | PRN
  Administered 2016-07-17: 10 mL/h via INTRAVENOUS

## 2016-07-08 MED ORDER — APIXABAN 5 MG PO TABS
5.0000 mg | ORAL_TABLET | Freq: Two times a day (BID) | ORAL | Status: DC
  Administered 2016-07-08 – 2016-07-22 (×28): 5 mg via ORAL
  Filled 2016-07-08 (×28): qty 1

## 2016-07-08 MED ORDER — INSULIN GLARGINE 100 UNIT/ML ~~LOC~~ SOLN
18.0000 [IU] | Freq: Every day | SUBCUTANEOUS | Status: DC
  Administered 2016-07-08 – 2016-07-21 (×13): 18 [IU] via SUBCUTANEOUS
  Filled 2016-07-08 (×16): qty 0.18

## 2016-07-08 MED ORDER — CLONAZEPAM 0.5 MG PO TABS
0.2500 mg | ORAL_TABLET | Freq: Two times a day (BID) | ORAL | Status: DC | PRN
  Administered 2016-07-11 – 2016-07-19 (×10): 0.25 mg via ORAL
  Filled 2016-07-08 (×11): qty 1

## 2016-07-08 MED ORDER — ACETAMINOPHEN 325 MG PO TABS: 650.0000 mg | ORAL_TABLET | ORAL | Status: DC | PRN

## 2016-07-08 MED ORDER — ONDANSETRON HCL 4 MG/2ML IJ SOLN: 4.0000 mg | Freq: Four times a day (QID) | INTRAMUSCULAR | Status: DC | PRN

## 2016-07-08 MED ORDER — B COMPLEX-C PO TABS
1.0000 | ORAL_TABLET | Freq: Every day | ORAL | Status: DC
  Administered 2016-07-08 – 2016-07-22 (×15): 1 via ORAL
  Filled 2016-07-08 (×15): qty 1

## 2016-07-08 MED ORDER — METOPROLOL TARTRATE 12.5 MG HALF TABLET: 12.5000 mg | ORAL_TABLET | Freq: Two times a day (BID) | ORAL | Status: DC

## 2016-07-08 MED ORDER — PYRIDOXINE HCL 25 MG PO TABS
25.0000 mg | ORAL_TABLET | Freq: Every day | ORAL | Status: DC
  Administered 2016-07-08 – 2016-07-22 (×15): 25 mg via ORAL
  Filled 2016-07-08 (×15): qty 1

## 2016-07-08 MED ORDER — VANCOMYCIN HCL IN DEXTROSE 1-5 GM/200ML-% IV SOLN
1000.0000 mg | Freq: Two times a day (BID) | INTRAVENOUS | Status: DC
  Administered 2016-07-08 – 2016-07-11 (×6): 1000 mg via INTRAVENOUS
  Filled 2016-07-08 (×7): qty 200

## 2016-07-08 MED ORDER — INSULIN ASPART 100 UNIT/ML ~~LOC~~ SOLN
0.0000 [IU] | SUBCUTANEOUS | Status: DC
Start: 2016-07-08 — End: 2016-07-22
  Administered 2016-07-08: 3 [IU] via SUBCUTANEOUS
  Administered 2016-07-08: 7 [IU] via SUBCUTANEOUS
  Administered 2016-07-09: 3 [IU] via SUBCUTANEOUS
  Administered 2016-07-10: 4 [IU] via SUBCUTANEOUS
  Administered 2016-07-10 (×2): 7 [IU] via SUBCUTANEOUS
  Administered 2016-07-10: 10 [IU] via SUBCUTANEOUS
  Administered 2016-07-10 – 2016-07-11 (×2): 3 [IU] via SUBCUTANEOUS
  Administered 2016-07-11 (×4): 4 [IU] via SUBCUTANEOUS
  Administered 2016-07-11: 3 [IU] via SUBCUTANEOUS
  Administered 2016-07-11: 4 [IU] via SUBCUTANEOUS
  Administered 2016-07-12: 1 [IU] via SUBCUTANEOUS
  Administered 2016-07-12 (×3): 4 [IU] via SUBCUTANEOUS
  Administered 2016-07-13: 3 [IU] via SUBCUTANEOUS
  Administered 2016-07-13: 4 [IU] via SUBCUTANEOUS
  Administered 2016-07-13: 3 [IU] via SUBCUTANEOUS
  Administered 2016-07-13: 7 [IU] via SUBCUTANEOUS
  Administered 2016-07-13: 4 [IU] via SUBCUTANEOUS
  Administered 2016-07-13 – 2016-07-14 (×4): 3 [IU] via SUBCUTANEOUS
  Administered 2016-07-14 – 2016-07-15 (×6): 4 [IU] via SUBCUTANEOUS
  Administered 2016-07-15 – 2016-07-16 (×2): 3 [IU] via SUBCUTANEOUS
  Administered 2016-07-16: 4 [IU] via SUBCUTANEOUS
  Administered 2016-07-16: 3 [IU] via SUBCUTANEOUS
  Administered 2016-07-16 – 2016-07-17 (×7): 4 [IU] via SUBCUTANEOUS
  Administered 2016-07-17: 7 [IU] via SUBCUTANEOUS
  Administered 2016-07-17 – 2016-07-18 (×2): 4 [IU] via SUBCUTANEOUS
  Administered 2016-07-18: 3 [IU] via SUBCUTANEOUS
  Administered 2016-07-18: 4 [IU] via SUBCUTANEOUS
  Administered 2016-07-18 – 2016-07-19 (×4): 3 [IU] via SUBCUTANEOUS
  Administered 2016-07-19: 4 [IU] via SUBCUTANEOUS
  Administered 2016-07-19 – 2016-07-20 (×2): 3 [IU] via SUBCUTANEOUS
  Administered 2016-07-20 (×2): 4 [IU] via SUBCUTANEOUS
  Administered 2016-07-21 – 2016-07-22 (×8): 3 [IU] via SUBCUTANEOUS

## 2016-07-08 MED ORDER — ZINC SULFATE 220 (50 ZN) MG PO CAPS
220.0000 mg | ORAL_CAPSULE | Freq: Every day | ORAL | Status: DC
  Administered 2016-07-08 – 2016-07-22 (×15): 220 mg via ORAL
  Filled 2016-07-08 (×15): qty 1

## 2016-07-08 MED ORDER — PIPERACILLIN-TAZOBACTAM 3.375 G IVPB
3.3750 g | Freq: Three times a day (TID) | INTRAVENOUS | Status: DC
  Administered 2016-07-08 – 2016-07-11 (×9): 3.375 g via INTRAVENOUS
  Filled 2016-07-08 (×10): qty 50

## 2016-07-08 MED ORDER — PIPERACILLIN-TAZOBACTAM 3.375 G IVPB 30 MIN
3.3750 g | Freq: Three times a day (TID) | INTRAVENOUS | Status: DC
  Administered 2016-07-08: 3.375 g via INTRAVENOUS
  Filled 2016-07-08 (×2): qty 50

## 2016-07-08 MED ORDER — FLECAINIDE ACETATE 100 MG PO TABS
100.0000 mg | ORAL_TABLET | Freq: Two times a day (BID) | ORAL | Status: DC
  Administered 2016-07-08 – 2016-07-22 (×28): 100 mg
  Filled 2016-07-08 (×30): qty 1

## 2016-07-08 MED ORDER — GUAIFENESIN 100 MG/5ML PO SOLN
10.0000 mL | Freq: Four times a day (QID) | ORAL | Status: DC
  Administered 2016-07-08 – 2016-07-22 (×55): 200 mg
  Filled 2016-07-08 (×4): qty 10
  Filled 2016-07-08 (×2): qty 5
  Filled 2016-07-08 (×7): qty 10
  Filled 2016-07-08: qty 5
  Filled 2016-07-08 (×12): qty 10
  Filled 2016-07-08: qty 5
  Filled 2016-07-08: qty 10
  Filled 2016-07-08: qty 5
  Filled 2016-07-08 (×4): qty 10
  Filled 2016-07-08 (×2): qty 5
  Filled 2016-07-08 (×5): qty 10
  Filled 2016-07-08: qty 5
  Filled 2016-07-08 (×6): qty 10
  Filled 2016-07-08: qty 5
  Filled 2016-07-08 (×2): qty 10
  Filled 2016-07-08: qty 5
  Filled 2016-07-08: qty 10
  Filled 2016-07-08 (×2): qty 5
  Filled 2016-07-08 (×4): qty 10
  Filled 2016-07-08: qty 5

## 2016-07-08 MED ORDER — CHLORHEXIDINE GLUCONATE 0.12% ORAL RINSE (MEDLINE KIT)
15.0000 mL | Freq: Two times a day (BID) | OROMUCOSAL | Status: DC
  Administered 2016-07-08 – 2016-07-22 (×28): 15 mL via OROMUCOSAL

## 2016-07-08 MED ORDER — METOPROLOL TARTRATE 25 MG PO TABS
25.0000 mg | ORAL_TABLET | Freq: Two times a day (BID) | ORAL | Status: DC
  Administered 2016-07-08 – 2016-07-22 (×28): 25 mg
  Filled 2016-07-08 (×27): qty 1

## 2016-07-08 MED ORDER — FLUCONAZOLE 40 MG/ML PO SUSR
100.0000 mg | Freq: Every day | ORAL | Status: AC
  Administered 2016-07-08 – 2016-07-10 (×3): 100 mg
  Filled 2016-07-08 (×3): qty 2.5

## 2016-07-08 MED ORDER — SODIUM CHLORIDE 0.9 % IV SOLN
INTRAVENOUS | Status: DC
  Administered 2016-07-08: 10:00:00 via INTRAVENOUS

## 2016-07-08 NOTE — Progress Notes (Signed)
[facility] PHYSICAL MEDICINE & REHABILITATION     PROGRESS NOTE  Subjective/Complaints:  Having more secretions. Reports fever/chills. 100.7 per RN after he had received tylenol. 99.3 this morning. Feels a little better after recent suction but still with secretions in airway.   ROS: pt denies nausea, vomiting, diarrhea,no dysuria    Objective: Vital Signs: Blood pressure (!) 144/58, pulse 97, temperature 99.3 F (37.4 C), temperature source Oral, resp. rate 18, height 6\' 2"  (1.88 m), weight 85 kg (187 lb 6.3 oz), SpO2 92 %. No results found.  Recent Labs  07/07/16 0535  WBC 8.4  HGB 9.9*  HCT 33.8*  PLT 299   No results for input(s): NA, K, CL, GLUCOSE, BUN, CREATININE, CALCIUM in the last 72 hours.  Invalid input(s): CO CBG (last 3)   Recent Labs  07/07/16 2000 07/08/16 0035 07/08/16 0415  GLUCAP 171* 181* 166*    Wt Readings from Last 3 Encounters:  07/01/16 85 kg (187 lb 6.3 oz)    Physical Exam:  BP (!) 144/58 (BP Location: Left Arm)   Pulse 97   Temp 99.3 F (37.4 C) (Oral)   Resp 18   Ht 6\' 2"  (1.88 m)   Wt 85 kg (187 lb 6.3 oz)   SpO2 92%   BMI 24.06 kg/m  Constitutional: appears uncomfortable.  HENT: Normocephalic. Atraumatic,   Eyes: EOMI. No discharge.  Neck: +trach, #6 with secretions around tube Cardiovascular: tachycardic, no murmurs. Respiratory: scattered rhonchi, rales at bases. Weak cough, can't clear secretions out of upper airways. Using accessory muscles to assist with breathing GI: Soft. Bowel sounds are normal.  Musculoskeletal: He exhibits no edema, no tenderness.  Neurological: Alert. Motor: B/l UE 4-/5 proximal to distal. B/l LE: 0/5 (stable) Sensation absent to light touch b/l LE No resting tone.  Skin:  Stage II sacral decubitus Small DTI on 1st digit LLE, dry/intact Psychiatric: Flat   Assessment/Plan: 1. Functional deficits secondary to T5 fracture/SCI with paraplegia which require 3+ hours per day of  interdisciplinary therapy in a comprehensive inpatient rehab setting. Physiatrist is providing close team supervision and 24 hour management of active medical problems listed below. Physiatrist and rehab team continue to assess barriers to discharge/monitor patient progress toward functional and medical goals.  Function:  Bathing Bathing position   Position: Bed  Bathing parts Body parts bathed by patient: Right arm, Left arm, Chest, Abdomen Body parts bathed by helper: Right arm, Left arm, Front perineal area, Chest, Abdomen, Right upper leg, Buttocks, Left upper leg, Left lower leg, Right lower leg, Back  Bathing assist Assist Level: Touching or steadying assistance(Pt > 75%)      Upper Body Dressing/Undressing Upper body dressing   What is the patient wearing?: Pull over shirt/dress     Pull over shirt/dress - Perfomed by patient: Thread/unthread right sleeve, Thread/unthread left sleeve, Put head through opening Pull over shirt/dress - Perfomed by helper: Pull shirt over trunk        Upper body assist Assist Level: 2 helpers      Lower Body Dressing/Undressing Lower body dressing   What is the patient wearing?: Pants, Socks, Shoes, [employer] Hose     Pants- Performed by patient: Thread/unthread right pants leg, Thread/unthread left pants leg Pants- Performed by helper: Pull pants up/down       Socks - Performed by helper: Don/doff right sock, Don/doff left sock Shoes - Performed by patient: Don/doff right shoe, Don/doff left shoe, Fasten right, Fasten left Shoes - Performed by helper:  Don/doff right shoe, Don/doff left shoe, Fasten right, Fasten left       TED Hose - Performed by helper: Don/doff right TED hose, Don/doff left TED hose  Lower body assist Assist for lower body dressing: Touching or steadying assistance (Pt > 75%)      Toileting Toileting Toileting activity did not occur: No continent bowel/bladder event        Toileting assist Assist level: Two helpers  (per NT report)   Transfers Chair/bed transfer   Chair/bed transfer method: Lateral scoot Chair/bed transfer assist level: Total assist (Pt < 25%) Chair/bed transfer assistive device: Sliding board, Armrests Mechanical lift: Maximove   Locomotion Ambulation Ambulation activity did not occur: Safety/medical concerns         Wheelchair   Type: Motorized Max wheelchair distance: 150 Assist Level: Total assistance (Pt < 25%)  Cognition Comprehension Comprehension assist level: Understands complex 90% of the time/cues 10% of the time  Expression Expression assist level: Expresses basic 75 - 89% of the time/requires cueing 10 - 24% of the time. Needs helper to occlude trach/needs to repeat words.  Social Interaction Social Interaction assist level: Interacts appropriately 90% of the time - Needs monitoring or encouragement for participation or interaction.  Problem Solving Problem solving assist level: Solves basic 50 - 74% of the time/requires cueing 25 - 49% of the time  Memory Memory assist level: Recognizes or recalls 75 - 89% of the time/requires cueing 10 - 24% of the time     Medical Problem List and Plan: 1. Functional and mobility deficitssecondary to T5 fracture/SCI with paraplegia. Multiple post- injury complications  Cont CIR as tolerated given pulmonary issues  RLE PRAFO  2. DVT Prophylaxis/Anticoagulation: :   Discussed with [facility] surgery, Eliquis resumed after discussion with pharmacy 3. Pain Management: tylenol, tramadol for more severe pain 4. Mood: team to provide ego support 5. Neuropsych: This patient is capable of making decisions on hisown behalf. 6. Skin/Wound Care: Air mattress overlay. Eucerin cream to bilateral feet. Maintain adequate nutritional and hydration status.   7. Fluids/Electrolytes/Nutrition:   Strict NPO at this time.   Elemental formula to assist with tolerance of TF   Increased water flushes. 8. Sacral decub Stage II: Has has a lot of  muscle loss--added protein supplement to tube feeds. Added additional vitamins to help promote healing. WOC  9. Pseudomonas Aeruginosa UTI with Urosepsis: Has been afebrile off antibiotics.   Has completed meropenum    Foley changed, as has been in 30 days.  10. Acute renal failure: Resolved.   Cr within acceptable limits 12/25    12. H/o A fib s/p DCCV: Eliquis restarted. Monitor HR qid.  13. Recent C diff colitis: treated.   -contact precautions.  14. Dysphagia: Continue NPO status. Speech therapy for PMSV and trials of ice chips as indicated. 15. DM type 2:   Hgb A1c 6.4  Monitor BS  Lantus 10 BID, increased to 13 on 12/19, increased to 15U on 12/22, increased to 18U on 12/25  -fair control overall. No changes today 16. Protein calorie Malnutrition: continue TF/ per RD      Cont to monitor 18. ABLA  Hb 9.9 today    Cont to monitor 19. Hx of VDRF with trach:  -now with low grade temp (with acetaminophen)  -more work of breathing and secretions.  -still has very weak cough, requiring increased, regular suction  -#6trach  -stat cxr  -bmet/cbc    -sputum cx  -continue robitussin DM QID, nebs  -  begin zosyn for questionable aspiration event  -continue chest PT,FV,IS  -have spoken with pulmonary medicine regarding follow up 20. Neurogenic bladder/bowel  Scheduled I/O cath  Cont bowel program  21. Hypoalbuminemia  Improving 1.9 on 12/20   Cont supplementation 22. Secondary HTN  Likely reactive, controlled 12/26    LOS (Days) 14 A FACE TO FACE EVALUATION WAS PERFORMED  SWARTZ,ZACHARY T 07/08/2016 8:38 AM

## 2016-07-08 NOTE — Progress Notes (Signed)
ANTICOAGULATION CONSULT NOTE - Initial Consult  Pharmacy Consult for eliquis Indication: atrial fibrillation  Allergies  Allergen Reactions  . Pradaxa [Dabigatran Etexilate Mesylate] Other (See Comments)    Tarry stools    Patient Measurements: Height: 6\' 2"  (188 cm) Weight: 187 lb 6.3 oz (85 kg) IBW/kg (Calculated) : 82.2   Vital Signs: Temp: 98 F (36.7 C) (12/29 1522) Temp Source: Axillary (12/29 1522) BP: 117/55 (12/29 1322) Pulse Rate: 101 (12/29 1322)  Labs:  Recent Labs  07/07/16 0535 07/08/16 0859  HGB 9.9* 10.4*  HCT 33.8* 35.1*  PLT 299 291  CREATININE  --  0.39*    Estimated Creatinine Clearance: 89.9 mL/min (by C-G formula based on SCr of 0.39 mg/dL (L)).   Medical History: Past Medical History:  Diagnosis Date  . Alzheimer's disease with early onset   . Atrial fibrillation, currently in sinus rhythm   . Bowel perforation (HCC) 1991   due to chicken wing  . Dyslipidemia   . HTN (hypertension)   . Optic nerve ischemia, right   . Vitamin D deficiency     Medications:  Prescriptions Prior to Admission  Medication Sig Dispense Refill Last Dose  . atorvastatin (LIPITOR) 10 MG tablet Place 10 mg into feeding tube daily with supper.     . Cholecalciferol (VITAMIN D-1000 MAX ST) 1000 units tablet Take 1,000 Units by mouth daily at 6 (six) AM.     . Cyanocobalamin (RA VITAMIN B-12 TR) 1000 MCG TBCR Take 1,000 mcg by mouth daily.     . flecainide (TAMBOCOR) 100 MG tablet Place 100 mg into feeding tube 2 (two) times daily.     . memantine (NAMENDA) 10 MG tablet Place 10 mg into feeding tube 2 (two) times daily.     . metoprolol tartrate (LOPRESSOR) 25 MG tablet Place 25 mg into feeding tube 2 (two) times daily.     Marland Kitchen. pyridOXINE (VITAMIN B-6) 25 MG tablet Place 25 mg into feeding tube daily.       Assessment: 75 yo M on Eliquis 5 mg po bid for  Afib.  Pt transferred from rehab to ICU for sepsis, edema and temperature. Wt 85 kg, CBC stable, Hg 10.4,  pltc wnl.  Creat 0.39.   Plan: continue Eliquis 5 mg po bid  Herby AbrahamMichelle T. Ashe Gago, Pharm.D. [phone] 07/08/2016 4:40 PM

## 2016-07-08 NOTE — Progress Notes (Signed)
Discharge summary job # [id number]

## 2016-07-08 NOTE — Patient Care Conference (Signed)
Inpatient RehabilitationTeam Conference and Plan of Care Update Date: 07/05/2016   Time: 2:00 PM    Patient Name: Samuel Peck      Medical Record Number: [id number]  Date of Birth: 1940-11-21 Sex: Male         Room/Bed: 4W14C/4W14C-01 Payor Info: Payor: BLUE CROSS BLUE SHIELD / Plan: BCBS OTHER / Product Type: *No Product type* /    Admitting Diagnosis: no diagnosis  Admit Date/Time:  06/24/2016  3:16 PM Admission Comments: No comment available   Primary Diagnosis:  T5 spinal cord injury (HCC) Principal Problem: T5 spinal cord injury [facility](HCC)  Patient Active Problem List   Diagnosis Date Noted  . Labile blood pressure   . Other secondary hypertension   . Neurogenic bowel   . Pneumonia due to infectious organism   . Neurogenic bladder   . Hypoalbuminemia due to protein-calorie malnutrition (HCC)   . Acute blood loss anemia   . Leukocytosis   . Diabetes mellitus type 2 in nonobese (HCC)   . PAF (paroxysmal atrial fibrillation) (HCC)   . T5 spinal cord injury (HCC) 06/24/2016  . Paraplegia (HCC) 06/24/2016  . Tracheostomy status (HCC) 06/24/2016  . Sacral decubitus ulcer, stage II 06/24/2016  . Bacterial UTI 06/24/2016  . Chronic atrial fibrillation (HCC) 06/24/2016  . 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 Pleural effusion, left   . Pneumothorax   . Encounter for nasogastric (NG) tube placement   . Admission for chest tube placement   . Acute respiratory failure [facility](HCC)     Expected Discharge Date: Expected Discharge Date: 07/22/16  Team Members Present: Physician leading conference: Dr. Maryla MorrowAnkit Patel Social Worker Present: Amada JupiterLucy Hoyle, LCSW Nurse Present: Samuel EndAngie Joyce, RN PT Present: Samuel Peck, PT OT Present: Samuel Peck, OT PPS Coordinator present : Samuel Peck, PT     Current Status/Progress Goal Weekly Team Focus  Medical   Functional and mobility deficits secondary to T5 fracture/SCI with paraplegia. Multiple post- injury complications  Improve mobility,  transfers, neurogenic bowel/bladder, trach, DM  See above   Bowel/Bladder   Foley D/C'd. Incontinent B/B. LBM 07/04/16-C.diff negative  Continent of B/B.  Monitor B/B Q shift and PRN. PVR PRN.   Swallow/Nutrition/ Hydration   NPO with PEG   Min A with least restrictive diet, likely to downgrade next week   No PO trials per MD; continue pharyngeal strengthening excercises and breath support   ADL's   Max A bed mobility; max A LB dressing; maximove/ +2 max A sliding board transfers; min A UB dressing  Mod-max overall  Upright tolerance; ADL re-training from bed level; sitting balance   Mobility   max a bed mobility, total assist transfer mod assist sitting balance  minA bed mobility, modA transfers, S w/c mobility in home  fucntional mobility, strengthening, endurance, chest pt, pt/family education.    Communication   PMSV with full supervision and Min assist for increased vocal intensity   Supervision  continue to increase breath support for vocal intensity    Safety/Cognition/ Behavioral Observations  Min A  Supervision   continue with recall via strategies   Pain   Denies any pain or discomforts.  Continue to have no pain  Monitor pain Q shift and PRN   Skin   MASD to bottom with nystatin cream to groin and peri area. Paraplegic.  MASD clear with no new skin breakdown or infection.  Monitor skin Q shift and PRN. Continue with current medications and creams. Turn Q2H and PRN.  Rehab Goals Patient on target to meet rehab goals: pt is making slow progress Rehab Goals Revised: none *See Care Plan and progress notes for long and short-term goals.  Barriers to Discharge: Mobility, safety, transfers, neurogenic bowel/bladder, trach, ABLA    Possible Resolutions to Barriers:  Therapies, pt and family edu,bowel/bladder program, follow BP, follow labs    Discharge Planning/Teaching Needs:  [name] plans to care for pt at home at d/c.  Wife is here daily until about lunchtime.  She  wants to be a part of his rehabilitation.   Team Discussion:  Pt is tolerating chest PT very well, but has thick secretions and increased heartrate.  He is still max to total assist with mobility with poor activity tolerance.  Wife continues to be very committed.  Dr. Riley KillSwartz is adjusting pt's diabetes medications and his BP is stabilizing.  Labs are being checked regularly.  Medications for pt's bowel are being started.  Team is feeling doubtful that trach will be able to be removed prior to pt's d/c.  Pt is not ready to trial ice chips yet.  Revisions to Treatment Plan:  none   Continued Need for Acute Rehabilitation Level of Care: The patient requires daily medical management by a physician with specialized training in physical medicine and rehabilitation for the following conditions: Daily direction of a multidisciplinary physical rehabilitation program to ensure safe treatment while eliciting the highest outcome that is of practical value to the patient.: Yes Daily medical management of patient stability for increased activity during participation in an intensive rehabilitation regime.: Yes Daily analysis of laboratory values and/or radiology reports with any subsequent need for medication adjustment of medical intervention for : Post surgical problems;Pulmonary problems;Neurological problems;Wound care problems;Urological problems  Samuel Peck, Samuel Peck 07/08/2016, 10:15 AM

## 2016-07-08 NOTE — Progress Notes (Signed)
Social Work Samuel Peck Samuel Sloane Palmer, LCSW Social Worker Signed   Patient Care Conference Date of Service: 07/08/2016 10:12 AM      Hide copied text Hover for attribution information Inpatient RehabilitationTeam Conference and Plan of Care Update Date: 07/05/2016   Time: 2:00 PM      Patient Name: Samuel Peck      Medical Record Number: [id number]  Date of Birth: 27-Aug-1940 Sex: Male         Room/Bed: 4W14C/4W14C-01 Payor Info: Payor: BLUE CROSS BLUE SHIELD / Plan: BCBS OTHER / Product Type: *No Product type* /     Admitting Diagnosis: no diagnosis  Admit Date/Time:  06/24/2016  3:16 PM Admission Comments: No comment available    Primary Diagnosis:  T5 spinal cord injury (HCC) Principal Problem: T5 spinal cord injury [facility](HCC)       Patient Active Problem List    Diagnosis Date Noted  . Labile blood pressure    . Other secondary hypertension    . Neurogenic bowel    . Pneumonia due to infectious organism    . Neurogenic bladder    . Hypoalbuminemia due to protein-calorie malnutrition (HCC)    . Acute blood loss anemia    . Leukocytosis    . Diabetes mellitus type 2 in nonobese (HCC)    . PAF (paroxysmal atrial fibrillation) (HCC)    . T5 spinal cord injury (HCC) 06/24/2016  . Paraplegia (HCC) 06/24/2016  . Tracheostomy status (HCC) 06/24/2016  . Sacral decubitus ulcer, stage II 06/24/2016  . Bacterial UTI 06/24/2016  . Chronic atrial fibrillation (HCC) 06/24/2016  . 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 Pleural effusion, left    . Pneumothorax    . Encounter for nasogastric (NG) tube placement    . Admission for chest tube placement    . Acute respiratory failure [facility](HCC)        Expected Discharge Date: Expected Discharge Date: 07/22/16   Team Members Present: Physician leading conference: Dr. Maryla MorrowAnkit Peck Social Worker Present: Samuel JupiterLucy Hoyle, LCSW Nurse Present: Samuel EndAngie Joyce, RN PT Present: Samuel Peck, PT OT Present: Samuel Peck, OT PPS Coordinator present : Samuel SnowballBecky  Peck, PT       Current Status/Progress Goal Weekly Team Focus  Medical   Functional and mobility deficits secondary to T5 fracture/SCI with paraplegia. Multiple post- injury complications  Improve mobility, transfers, neurogenic bowel/bladder, trach, DM  See above   Bowel/Bladder   Foley D/Samuel'd. Incontinent B/B. LBM 07/04/16-Samuel.diff negative  Continent of B/B.  Monitor B/B Q shift and PRN. PVR PRN.   Swallow/Nutrition/ Hydration   NPO with PEG   Min A with least restrictive diet, likely to downgrade next week   No PO trials per MD; continue pharyngeal strengthening excercises and breath support   ADL's   Max A bed mobility; max A LB dressing; maximove/ +2 max A sliding board transfers; min A UB dressing  Mod-max overall  Upright tolerance; ADL re-training from bed level; sitting balance   Mobility   max a bed mobility, total assist transfer mod assist sitting balance  minA bed mobility, modA transfers, S w/Samuel mobility in home  fucntional mobility, strengthening, endurance, chest pt, pt/family education.    Communication   PMSV with full supervision and Min assist for increased vocal intensity   Supervision  continue to increase breath support for vocal intensity    Safety/Cognition/ Behavioral Observations Min A  Supervision   continue with recall via strategies   Pain   Denies any pain  or discomforts.  Continue to have no pain  Monitor pain Q shift and PRN   Skin   MASD to bottom with nystatin cream to groin and peri area. Paraplegic.  MASD clear with no new skin breakdown or infection.  Monitor skin Q shift and PRN. Continue with current medications and creams. Turn Q2H and PRN.     Rehab Goals Patient on target to meet rehab goals: pt is making slow progress Rehab Goals Revised: none *See Care Plan and progress notes for long and short-term goals.   Barriers to Discharge: Mobility, safety, transfers, neurogenic bowel/bladder, trach, ABLA   Possible Resolutions to Barriers:  Therapies,  pt and family edu,bowel/bladder program, follow BP, follow labs   Discharge Planning/Teaching Needs:  [name] plans to care for pt at home at d/Samuel.  Wife is here daily until about lunchtime.  She wants to be a part of his rehabilitation.   Team Discussion:  Pt is tolerating chest PT very well, but has thick secretions and increased heartrate.  He is still max to total assist with mobility with poor activity tolerance.  Wife continues to be very committed.  Samuel Peck is adjusting pt's diabetes medications and his BP is stabilizing.  Labs are being checked regularly.  Medications for pt's bowel are being started.  Team is feeling doubtful that trach will be able to be removed prior to pt's d/Samuel.  Pt is not ready to trial ice chips yet.  Revisions to Treatment Plan:  none    Continued Need for Acute Rehabilitation Level of Care: The patient requires daily medical management by a physician with specialized training in physical medicine and rehabilitation for the following conditions: Daily direction of a multidisciplinary physical rehabilitation program to ensure safe treatment while eliciting the highest outcome that is of practical value to the patient.: Yes Daily medical management of patient stability for increased activity during participation in an intensive rehabilitation regime.: Yes Daily analysis of laboratory values and/or radiology reports with any subsequent need for medication adjustment of medical intervention for : Post surgical problems;Pulmonary problems;Neurological problems;Wound care problems;Urological problems   Samuel Peck, Samuel Peck 07/08/2016, 10:15 AM       Patient ID: Samuel Peck, male   DOB: Nov 08, 1940, 75 y.o.   MRN: [id number]

## 2016-07-08 NOTE — Progress Notes (Signed)
Upon shift change, patient noted to be flushed with a temperature reported overnight.  Upon assessment, temp 102.6 rectally, increased HR (120s at rest), and copious secretions from trach.  MD notified and new orders carried out for blood cultures, sputum culture, urine culture, IVF, IV antibiotics.  Wife (retired Insurance underwriterCU nurse) expressed her concerns for patient since his increasing edema and temperature.  MD answered spouse questions.  Received orders to transfer patient to ICU for sepsis.  Report called to nurse on 4 [place].  Dani Gobbleeardon, Samuel Lindfors J, RN

## 2016-07-08 NOTE — Progress Notes (Signed)
Trach changed from #6 cuffless to Shiley #6 cuffed to facilitate being put on vent.  Trach removed with minimal difficulty and replaced with minimal resistance.  Pt tolerated this well, BBS equal, ETCO2 positive for color change.  RN aware.

## 2016-07-08 NOTE — Progress Notes (Addendum)
Occupational Therapy Note  Patient Details  Name: Samuel Peck MRN: [id number] Date of Birth: [birth year]  Today's Date: 07/08/2016 OT Individual Time: 0700-0710 OT Individual Time Calculation (min): 10 min  OT Missed Time: 50 Min  (Pt fatigue, elevated HR; RN request)  Pt in supine upon arrival, voicing having not slept over night following "shaker" procedure yesterday afternoon.  Pt's HR 112-120 BPM at rest in supine. O2 92% on 5L supplemental O2.  RN made aware. RN requested therapy hold services until MD is able to assess as pt also with fever. Pt coughing up thick light green secretions through trach site.  Discussed at length with pt current condition, need for medical stability prior to physical progression, and POC. Pt very worried that not having therapy will set him back, however, pt not medically stabile for mobility at current time. RN to make MD aware. Pt left in supine, all needs in reach.    [name], Shriya Aker C 07/08/2016, 7:34 AM

## 2016-07-08 NOTE — Progress Notes (Signed)
Social Work Patient ID: Samuel LeventhalCharles Peck, male   DOB: 24-May-1941, 75 y.o.   MRN: [id number]   CSW was to meet with pt and wife today to update them on team conference discussion, however, pt is not doing well medically and therapies are on hold and now pt to transfer back to acute.  CSW spoke with pt and wife to tell them the process for readmission to CIR and that pt would need to be re-evaluated by therapies once stable and determination for CIR would be made after that.  CSW told wife to call if she has any questions/concerns, even from acute.  She was appreciative of care pt has received on CIR.

## 2016-07-08 NOTE — Progress Notes (Signed)
Physical Therapy Note  Patient Details  Name: Erasmo LeventhalCharles Vanderstelt MRN: [id number] Date of Birth: 04/21/1941 Today's Date: 07/08/2016    Pt missed 30 min of skilled PT due to medical issues with plan for patient to be transferred back to acute.    Karolee StampsGray, Tereso Unangst Darrol PokeBrescia  Wofford Stratton B. Doree Kuehne, PT, DPT  07/08/2016, 11:53 AM

## 2016-07-08 NOTE — Progress Notes (Addendum)
Speech Language Pathology Note  Patient Details  Name: Erasmo LeventhalCharles Tomkins MRN: [id number] Date of Birth: April 27, 1941 Today's Date: 07/08/2016  Patient missed 60 minutes of skilled SLP intervention (2 separate sessions) due to not feeling well (increased secretions with increased work of breathing, low grade fever, chills) with portable x-ray present to perform a STAT chest x-ray and STAT blood work being performed.    Cherylann Hobday 07/08/2016, 9:01 AM

## 2016-07-08 NOTE — Progress Notes (Signed)
Physical Therapy Note  Patient Details  Name: Samuel LeventhalCharles Peck MRN: [id number] Date of Birth: 1941/02/17 Today's Date: 07/08/2016    Patient unable to participate in scheduled therapy session today at 0900 secondary to elevated HR and RR. Patient with significant fatigue, will attempt when patient is more stable.    [name],[name] 07/08/2016, 11:55 AM

## 2016-07-08 NOTE — Progress Notes (Signed)
Social Work Discharge Note  The overall goal for the admission was met for:   Discharge location: No - return to acute hospital due to medical issues  Length of Stay: No - 14 days  Discharge activity level: No  Home/community participation: No  Services provided included: MD, RD, PT, OT, SLP, RN, Pharmacy and SW  Financial Services: Medicare and Private Insurance: [employer]  Follow-up services arranged: Other: none as pt transfered to acute  Comments (or additional information):  CSW met with pt and wife to update them on process for pt to be readmitted to CIR.  Explained that pt would need to be re-evaluated by acute therapists and their recommendation be for CIR and then our admissions team would continue to follow pt on acute for appropriateness for return to CIR and bed availability.  CSW met with therapy team and they would want to work with pt again, pending medical stability, as there were times pt could not participate fully due to medical issues.  CSW also spoke with Karene Fry, Admissions Coordinator, who will have admissions team follow pt on acute.  [name] was pleased to know process for readmission and pleased with pt's care here and wants pt to return when he is able.  CSW remains available as needed.  Patient/Family verbalized understanding of follow-up arrangements: Yes  Individual responsible for coordination of the follow-up plan: pt's wife  Confirmed correct DME delivered: Trey Sailors 07/08/2016    Chancey Ringel, Silvestre Mesi
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
## 2016-07-09 ENCOUNTER — Inpatient Hospital Stay (HOSPITAL_COMMUNITY): Payer: Medicare Other

## 2016-07-09 DIAGNOSIS — A0471 Enterocolitis due to Clostridium difficile, recurrent: Secondary | ICD-10-CM

## 2016-07-09 LAB — URINE CULTURE

## 2016-07-09 LAB — MAGNESIUM
MAGNESIUM: 1.8 mg/dL (ref 1.7–2.4)
MAGNESIUM: 1.8 mg/dL (ref 1.7–2.4)
Magnesium: 1.9 mg/dL (ref 1.7–2.4)

## 2016-07-09 LAB — PHOSPHORUS
Phosphorus: 4 mg/dL (ref 2.5–4.6)
Phosphorus: 4.2 mg/dL (ref 2.5–4.6)
Phosphorus: 4.2 mg/dL (ref 2.5–4.6)

## 2016-07-09 LAB — BASIC METABOLIC PANEL
Anion gap: 11 (ref 5–15)
BUN: 22 mg/dL — AB (ref 6–20)
CHLORIDE: 101 mmol/L (ref 101–111)
CO2: 27 mmol/L (ref 22–32)
CREATININE: 0.34 mg/dL — AB (ref 0.61–1.24)
Calcium: 8.5 mg/dL — ABNORMAL LOW (ref 8.9–10.3)
GFR calc Af Amer: >60 mL/min (ref >60)
GFR calc non Af Amer: >60 mL/min (ref >60)
Glucose, Bld: 80 mg/dL (ref 65–99)
Potassium: 3.9 mmol/L (ref 3.5–5.1)
SODIUM: 139 mmol/L (ref 135–145)

## 2016-07-09 LAB — LEGIONELLA PNEUMOPHILA SEROGP 1 UR AG: L. PNEUMOPHILA SEROGP 1 UR AG: NEGATIVE

## 2016-07-09 LAB — CBC
HCT: 30.6 % — ABNORMAL LOW (ref 39.0–52.0)
HEMOGLOBIN: 9.3 g/dL — AB (ref 13.0–17.0)
MCH: 25.2 pg — AB (ref 26.0–34.0)
MCHC: 30.4 g/dL (ref 30.0–36.0)
MCV: 82.9 fL (ref 78.0–100.0)
Platelets: 286 10*3/uL (ref 150–400)
RBC: 3.69 MIL/uL — ABNORMAL LOW (ref 4.22–5.81)
RDW: 17.8 % — ABNORMAL HIGH (ref 11.5–15.5)
WBC: 16.3 10*3/uL — ABNORMAL HIGH (ref 4.0–10.5)

## 2016-07-09 LAB — C DIFFICILE QUICK SCREEN W PCR REFLEX
C DIFFICILE (CDIFF) INTERP: DETECTED
C DIFFICILE (CDIFF) TOXIN: POSITIVE — AB
C Diff antigen: POSITIVE — AB

## 2016-07-09 LAB — PROCALCITONIN: Procalcitonin: 0.36 ng/mL

## 2016-07-09 MED ORDER — VANCOMYCIN 50 MG/ML ORAL SOLUTION: 125.0000 mg | Freq: Every day | ORAL | Status: DC

## 2016-07-09 MED ORDER — PRO-STAT SUGAR FREE PO LIQD: 30.0000 mL | Freq: Two times a day (BID) | ORAL | Status: DC

## 2016-07-09 MED ORDER — VANCOMYCIN 50 MG/ML ORAL SOLUTION
125.0000 mg | Freq: Four times a day (QID) | ORAL | Status: DC
  Administered 2016-07-09 – 2016-07-22 (×54): 125 mg via ORAL
  Filled 2016-07-09 (×59): qty 2.5

## 2016-07-09 MED ORDER — VITAL HIGH PROTEIN PO LIQD
1000.0000 mL | ORAL | Status: DC
  Filled 2016-07-09 (×2): qty 1000

## 2016-07-09 MED ORDER — DEXTROSE 50 % IV SOLN
INTRAVENOUS | Status: AC
  Administered 2016-07-09: 25 mL
  Filled 2016-07-09: qty 50

## 2016-07-09 MED ORDER — PIVOT 1.5 CAL PO LIQD
1000.0000 mL | ORAL | Status: DC
Start: 2016-07-09 — End: 2016-07-22
  Administered 2016-07-09 – 2016-07-22 (×12): 1000 mL
  Filled 2016-07-09 (×22): qty 1000

## 2016-07-09 MED ORDER — VANCOMYCIN 50 MG/ML ORAL SOLUTION: 125.0000 mg | Freq: Two times a day (BID) | ORAL | Status: DC

## 2016-07-09 MED ORDER — PRO-STAT SUGAR FREE PO LIQD
30.0000 mL | Freq: Every day | ORAL | Status: DC
  Administered 2016-07-10 – 2016-07-19 (×9): 30 mL
  Administered 2016-07-20: 10:00:00
  Administered 2016-07-21 – 2016-07-22 (×2): 30 mL
  Filled 2016-07-09 (×12): qty 30

## 2016-07-09 MED ORDER — FAMOTIDINE 20 MG PO TABS
20.0000 mg | ORAL_TABLET | Freq: Two times a day (BID) | ORAL | Status: DC
  Administered 2016-07-09 – 2016-07-22 (×26): 20 mg
  Filled 2016-07-09 (×26): qty 1

## 2016-07-09 MED ORDER — VANCOMYCIN 50 MG/ML ORAL SOLUTION: 125.0000 mg | ORAL | Status: DC

## 2016-07-09 MED ORDER — ORAL CARE MOUTH RINSE: 15.0000 mL | OROMUCOSAL | Status: DC

## 2016-07-09 MED ORDER — CHLORHEXIDINE GLUCONATE 0.12% ORAL RINSE (MEDLINE KIT): 15.0000 mL | Freq: Two times a day (BID) | OROMUCOSAL | Status: DC

## 2016-07-09 MED ORDER — LACTATED RINGERS IV SOLN
INTRAVENOUS | Status: DC
  Administered 2016-07-09: 13:00:00 via INTRAVENOUS
  Administered 2016-07-12: 40 mL via INTRAVENOUS
  Filled 2016-07-09: qty 1000

## 2016-07-09 NOTE — Progress Notes (Signed)
Hypoglycemic Event  CBG: 64  Treatment: started tube feeds  Symptoms: None  Follow-up CBG: Time: 1340   CBG Result: 67  Possible Reasons for Event: Inadequate meal intake  Comments/MD notified: 25 mL of D50 given.  Re-check CBG 125.  E-Link MD notified   Samuel Peck, Samuel Peck

## 2016-07-09 NOTE — Discharge Summary (Deleted)
  The note originally documented on this encounter has been moved the the encounter in which it belongs.  
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
## 2016-07-09 NOTE — Progress Notes (Signed)
eLink Physician-Brief Progress Note Patient Name: Samuel LeventhalCharles Peck DOB: 13-Feb-1941 MRN: [id number]   Date of Service  07/09/2016  HPI/Events of Note  Diarrhea - C. Difficile positive. Request for Flexiseal.  eICU Interventions  Will order:  Place Flexiseal.       Intervention Category Intermediate Interventions: Other:  Lenell AntuSommer,Steven Eugene 07/09/2016, 3:34 PM
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
## 2016-07-10 ENCOUNTER — Inpatient Hospital Stay (HOSPITAL_COMMUNITY): Payer: Medicare Other

## 2016-07-10 DIAGNOSIS — A0472 Enterocolitis due to Clostridium difficile, not specified as recurrent: Secondary | ICD-10-CM

## 2016-07-10 DIAGNOSIS — E43 Unspecified severe protein-calorie malnutrition: Secondary | ICD-10-CM | POA: Insufficient documentation

## 2016-07-10 LAB — COMPREHENSIVE METABOLIC PANEL
ALK PHOS: 126 U/L (ref 38–126)
ALT: 40 U/L (ref 17–63)
ANION GAP: 9 (ref 5–15)
AST: 33 U/L (ref 15–41)
Albumin: 1.5 g/dL — ABNORMAL LOW (ref 3.5–5.0)
BILIRUBIN TOTAL: 0.5 mg/dL (ref 0.3–1.2)
BUN: 21 mg/dL — ABNORMAL HIGH (ref 6–20)
CALCIUM: 8.1 mg/dL — AB (ref 8.9–10.3)
CO2: 28 mmol/L (ref 22–32)
Chloride: 99 mmol/L — ABNORMAL LOW (ref 101–111)
Creatinine, Ser: 0.43 mg/dL — ABNORMAL LOW (ref 0.61–1.24)
GFR calc non Af Amer: >60 mL/min (ref >60)
Glucose, Bld: 227 mg/dL — ABNORMAL HIGH (ref 65–99)
POTASSIUM: 3.4 mmol/L — AB (ref 3.5–5.1)
Sodium: 136 mmol/L (ref 135–145)
TOTAL PROTEIN: 5.5 g/dL — AB (ref 6.5–8.1)

## 2016-07-10 LAB — MAGNESIUM
MAGNESIUM: 1.7 mg/dL (ref 1.7–2.4)
MAGNESIUM: 2.1 mg/dL (ref 1.7–2.4)

## 2016-07-10 LAB — CBC
HEMATOCRIT: 29.2 % — AB (ref 39.0–52.0)
HEMOGLOBIN: 9.1 g/dL — AB (ref 13.0–17.0)
MCH: 25.2 pg — ABNORMAL LOW (ref 26.0–34.0)
MCHC: 31.2 g/dL (ref 30.0–36.0)
MCV: 80.9 fL (ref 78.0–100.0)
Platelets: 284 10*3/uL (ref 150–400)
RBC: 3.61 MIL/uL — ABNORMAL LOW (ref 4.22–5.81)
RDW: 18.3 % — AB (ref 11.5–15.5)
WBC: 10 10*3/uL (ref 4.0–10.5)

## 2016-07-10 LAB — PROCALCITONIN: PROCALCITONIN: 0.14 ng/mL

## 2016-07-10 LAB — VANCOMYCIN, TROUGH: VANCOMYCIN TR: 11 ug/mL — AB (ref 15–20)

## 2016-07-10 LAB — PHOSPHORUS
PHOSPHORUS: 3.8 mg/dL (ref 2.5–4.6)
Phosphorus: 4.1 mg/dL (ref 2.5–4.6)

## 2016-07-10 MED ORDER — MAGNESIUM SULFATE 2 GM/50ML IV SOLN
2.0000 g | Freq: Once | INTRAVENOUS | Status: AC
Start: 2016-07-10 — End: 2016-07-10
  Administered 2016-07-10: 2 g via INTRAVENOUS
  Filled 2016-07-10: qty 50

## 2016-07-10 MED ORDER — FUROSEMIDE 10 MG/ML IJ SOLN
40.0000 mg | Freq: Once | INTRAMUSCULAR | Status: AC
  Administered 2016-07-10: 40 mg via INTRAVENOUS
  Filled 2016-07-10: qty 4

## 2016-07-10 MED ORDER — POTASSIUM CHLORIDE 20 MEQ/15ML (10%) PO SOLN
40.0000 meq | Freq: Four times a day (QID) | ORAL | Status: AC
  Administered 2016-07-10 (×2): 40 meq
  Filled 2016-07-10 (×2): qty 30
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
## 2016-07-11 ENCOUNTER — Inpatient Hospital Stay (HOSPITAL_COMMUNITY): Payer: Medicare Other

## 2016-07-11 LAB — GLUCOSE, CAPILLARY
GLUCOSE-CAPILLARY: 114 mg/dL — AB (ref 65–99)
GLUCOSE-CAPILLARY: 152 mg/dL — AB (ref 65–99)
GLUCOSE-CAPILLARY: 153 mg/dL — AB (ref 65–99)
GLUCOSE-CAPILLARY: 181 mg/dL — AB (ref 65–99)
GLUCOSE-CAPILLARY: 218 mg/dL — AB (ref 65–99)
GLUCOSE-CAPILLARY: 67 mg/dL (ref 65–99)
GLUCOSE-CAPILLARY: 92 mg/dL (ref 65–99)
GLUCOSE-CAPILLARY: 99 mg/dL (ref 65–99)
Glucose-Capillary: 124 mg/dL — ABNORMAL HIGH (ref 65–99)
Glucose-Capillary: 125 mg/dL — ABNORMAL HIGH (ref 65–99)
Glucose-Capillary: 135 mg/dL — ABNORMAL HIGH (ref 65–99)
Glucose-Capillary: 138 mg/dL — ABNORMAL HIGH (ref 65–99)
Glucose-Capillary: 149 mg/dL — ABNORMAL HIGH (ref 65–99)
Glucose-Capillary: 166 mg/dL — ABNORMAL HIGH (ref 65–99)
Glucose-Capillary: 223 mg/dL — ABNORMAL HIGH (ref 65–99)
Glucose-Capillary: 64 mg/dL — ABNORMAL LOW (ref 65–99)
Glucose-Capillary: 72 mg/dL (ref 65–99)
Glucose-Capillary: 99 mg/dL (ref 65–99)
Glucose-Capillary: 128 mg/dL — ABNORMAL HIGH (ref 65–99)

## 2016-07-11 LAB — BASIC METABOLIC PANEL
ANION GAP: 11 (ref 5–15)
BUN: 20 mg/dL (ref 6–20)
CHLORIDE: 102 mmol/L (ref 101–111)
CO2: 25 mmol/L (ref 22–32)
Calcium: 8.1 mg/dL — ABNORMAL LOW (ref 8.9–10.3)
Creatinine, Ser: 0.45 mg/dL — ABNORMAL LOW (ref 0.61–1.24)
GFR calc Af Amer: >60 mL/min (ref >60)
GFR calc non Af Amer: >60 mL/min (ref >60)
Glucose, Bld: 161 mg/dL — ABNORMAL HIGH (ref 65–99)
POTASSIUM: 3.5 mmol/L (ref 3.5–5.1)
SODIUM: 138 mmol/L (ref 135–145)

## 2016-07-11 LAB — CBC
HEMATOCRIT: 29.7 % — AB (ref 39.0–52.0)
HEMOGLOBIN: 9.2 g/dL — AB (ref 13.0–17.0)
MCH: 25.3 pg — ABNORMAL LOW (ref 26.0–34.0)
MCHC: 31 g/dL (ref 30.0–36.0)
MCV: 81.8 fL (ref 78.0–100.0)
Platelets: 322 10*3/uL (ref 150–400)
RBC: 3.63 MIL/uL — AB (ref 4.22–5.81)
RDW: 18.4 % — AB (ref 11.5–15.5)
WBC: 10 10*3/uL (ref 4.0–10.5)

## 2016-07-11 LAB — CULTURE, RESPIRATORY

## 2016-07-11 LAB — CULTURE, RESPIRATORY W GRAM STAIN

## 2016-07-11 MED ORDER — DEXTROSE 5 % IV SOLN
1.0000 g | INTRAVENOUS | Status: DC
  Administered 2016-07-11 – 2016-07-13 (×3): 1 g via INTRAVENOUS
  Filled 2016-07-11 (×5): qty 10

## 2016-07-11 MED ORDER — FLUCONAZOLE 40 MG/ML PO SUSR
100.0000 mg | Freq: Every day | ORAL | Status: DC
  Administered 2016-07-11 – 2016-07-20 (×10): 100 mg
  Filled 2016-07-11 (×11): qty 2.5

## 2016-07-11 MED ORDER — FUROSEMIDE 10 MG/ML IJ SOLN
40.0000 mg | Freq: Once | INTRAMUSCULAR | Status: AC
  Administered 2016-07-11: 40 mg via INTRAVENOUS
  Filled 2016-07-11: qty 4

## 2016-07-11 MED ORDER — VANCOMYCIN HCL 10 G IV SOLR
1250.0000 mg | Freq: Two times a day (BID) | INTRAVENOUS | Status: DC
  Administered 2016-07-11 – 2016-07-14 (×6): 1250 mg via INTRAVENOUS
  Filled 2016-07-11 (×7): qty 1250

## 2016-07-11 MED ORDER — POTASSIUM CHLORIDE 20 MEQ/15ML (10%) PO SOLN
40.0000 meq | Freq: Once | ORAL | Status: AC
  Administered 2016-07-11: 40 meq via ORAL
  Filled 2016-07-11: qty 30

## 2016-07-11 NOTE — Progress Notes (Signed)
Chest vest not performed at this time due to patient sleeping. RT will continue to monitor.
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
## 2016-07-11 NOTE — Progress Notes (Signed)
RT unavailable at 1600 CPT. Available now and patient is sleeping. Will attempt again at next scheduled CPT.

## 2016-07-12 ENCOUNTER — Inpatient Hospital Stay (HOSPITAL_COMMUNITY): Payer: Medicare Other

## 2016-07-12 LAB — CBC
HCT: 30.2 % — ABNORMAL LOW (ref 39.0–52.0)
Hemoglobin: 9.2 g/dL — ABNORMAL LOW (ref 13.0–17.0)
MCH: 25.2 pg — AB (ref 26.0–34.0)
MCHC: 30.5 g/dL (ref 30.0–36.0)
MCV: 82.7 fL (ref 78.0–100.0)
PLATELETS: 313 10*3/uL (ref 150–400)
RBC: 3.65 MIL/uL — ABNORMAL LOW (ref 4.22–5.81)
RDW: 18.2 % — ABNORMAL HIGH (ref 11.5–15.5)
WBC: 12.1 10*3/uL — ABNORMAL HIGH (ref 4.0–10.5)

## 2016-07-12 LAB — GLUCOSE, CAPILLARY
Glucose-Capillary: 171 mg/dL — ABNORMAL HIGH (ref 65–99)
Glucose-Capillary: 167 mg/dL — ABNORMAL HIGH (ref 65–99)
Glucose-Capillary: 166 mg/dL — ABNORMAL HIGH (ref 65–99)
Glucose-Capillary: 158 mg/dL — ABNORMAL HIGH (ref 65–99)
Glucose-Capillary: 120 mg/dL — ABNORMAL HIGH (ref 65–99)
Glucose-Capillary: 160 mg/dL — ABNORMAL HIGH (ref 65–99)

## 2016-07-12 LAB — BASIC METABOLIC PANEL
Anion gap: 8 (ref 5–15)
BUN: 20 mg/dL (ref 6–20)
CALCIUM: 8.3 mg/dL — AB (ref 8.9–10.3)
CHLORIDE: 105 mmol/L (ref 101–111)
CO2: 25 mmol/L (ref 22–32)
CREATININE: 0.31 mg/dL — AB (ref 0.61–1.24)
GFR calc Af Amer: >60 mL/min (ref >60)
GFR calc non Af Amer: >60 mL/min (ref >60)
Glucose, Bld: 106 mg/dL — ABNORMAL HIGH (ref 65–99)
Potassium: 4.3 mmol/L (ref 3.5–5.1)
SODIUM: 138 mmol/L (ref 135–145)

## 2016-07-12 MED ORDER — FUROSEMIDE 10 MG/ML IJ SOLN
40.0000 mg | Freq: Once | INTRAMUSCULAR | Status: AC
  Administered 2016-07-12: 40 mg via INTRAVENOUS
  Filled 2016-07-12: qty 4
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
## 2016-07-12 NOTE — Consult Note (Addendum)
WOC Nurse wound consult note Pt is familiar to Samuel Peck team; previous consult was performed on 12/18.  Pt has become critically ill since initial consult and wounds have declined.  Reason for Consult: Consult requested for buttocks. Wound type: Pt has been incontinent of stool and had C diff and is frequently moist to the inner gluteal cleft.  He was noted to have a full thickness fissure related to moisture which was present on admission; this has evolved into a stage 3 pressure injury when assessed today.  2X.3X.3cm, moist red wound bed, mod amt yellow drainage, no odor. Lower buttock with stage 2 wound; 2X2X.1cm, pink and moist.  Bilat buttocks with patchy areas of partial thickness skin loss related to moisture associated skin damage. Dressing procedure/placement/frequency: Aquacel to absorb drainage and provide antimicrobial benifits and foam dressing to protect from further injury. Please re-consult if further assistance is needed. Thank-you,  Samuel Mcgeeawn Shia Eber MSN, RN, CWOCN, [place]CN-AP, CNS [phone]

## 2016-07-12 NOTE — Progress Notes (Signed)
Chest vest not done at this time. Patient sleeping.
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
## 2016-07-12 NOTE — Progress Notes (Signed)
Inpatient Rehabilitation  Pt. is well known to me from his CIR admission 12/15-12/29/17.  I will follow his medical course for a potential readmission to IP rehab when appropriate.    Weldon PickingSusan Gevin Perea PT Inpatient Rehab Admissions Coordinator Cell [phone] Office [phone]

## 2016-07-13 ENCOUNTER — Inpatient Hospital Stay (HOSPITAL_COMMUNITY): Payer: Medicare Other

## 2016-07-13 ENCOUNTER — Encounter (HOSPITAL_COMMUNITY): Payer: Medicare Other

## 2016-07-13 LAB — BASIC METABOLIC PANEL
ANION GAP: 6 (ref 5–15)
BUN: 22 mg/dL — ABNORMAL HIGH (ref 6–20)
CO2: 26 mmol/L (ref 22–32)
Calcium: 8.4 mg/dL — ABNORMAL LOW (ref 8.9–10.3)
Chloride: 108 mmol/L (ref 101–111)
Creatinine, Ser: 0.36 mg/dL — ABNORMAL LOW (ref 0.61–1.24)
Glucose, Bld: 179 mg/dL — ABNORMAL HIGH (ref 65–99)
POTASSIUM: 4.6 mmol/L (ref 3.5–5.1)
Sodium: 140 mmol/L (ref 135–145)

## 2016-07-13 LAB — CULTURE, BLOOD (ROUTINE X 2)
Culture: NO GROWTH
Culture: NO GROWTH

## 2016-07-13 LAB — CULTURE, BLOOD (SINGLE)
Culture: NO GROWTH
Culture: NO GROWTH

## 2016-07-13 LAB — CBC
HEMATOCRIT: 30.4 % — AB (ref 39.0–52.0)
HEMOGLOBIN: 9.1 g/dL — AB (ref 13.0–17.0)
MCH: 25.2 pg — ABNORMAL LOW (ref 26.0–34.0)
MCHC: 29.9 g/dL — ABNORMAL LOW (ref 30.0–36.0)
MCV: 84.2 fL (ref 78.0–100.0)
Platelets: 373 10*3/uL (ref 150–400)
RBC: 3.61 MIL/uL — AB (ref 4.22–5.81)
RDW: 18.2 % — ABNORMAL HIGH (ref 11.5–15.5)
WBC: 12.9 10*3/uL — ABNORMAL HIGH (ref 4.0–10.5)

## 2016-07-13 LAB — GLUCOSE, CAPILLARY
GLUCOSE-CAPILLARY: 139 mg/dL — AB (ref 65–99)
GLUCOSE-CAPILLARY: 169 mg/dL — AB (ref 65–99)
GLUCOSE-CAPILLARY: 186 mg/dL — AB (ref 65–99)
GLUCOSE-CAPILLARY: 201 mg/dL — AB (ref 65–99)
Glucose-Capillary: 122 mg/dL — ABNORMAL HIGH (ref 65–99)

## 2016-07-13 LAB — MAGNESIUM: MAGNESIUM: 1.9 mg/dL (ref 1.7–2.4)

## 2016-07-13 MED ORDER — FUROSEMIDE 10 MG/ML IJ SOLN
40.0000 mg | Freq: Once | INTRAMUSCULAR | Status: AC
  Administered 2016-07-13: 40 mg via INTRAVENOUS
  Filled 2016-07-13: qty 4

## 2016-07-13 MED ORDER — BACITRACIN-NEOMYCIN-POLYMYXIN OINTMENT TUBE
1.0000 "application " | TOPICAL_OINTMENT | Freq: Every day | CUTANEOUS | Status: AC
  Administered 2016-07-13 – 2016-07-19 (×7): 1 via TOPICAL
  Filled 2016-07-13: qty 15
  Filled 2016-07-13: qty 1

## 2016-07-13 NOTE — Progress Notes (Signed)
Placed on Ventilator at this time, pt tolerating well. RT to continue to monitor
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
## 2016-07-14 ENCOUNTER — Inpatient Hospital Stay (HOSPITAL_COMMUNITY): Payer: Medicare Other

## 2016-07-14 DIAGNOSIS — J9621 Acute and chronic respiratory failure with hypoxia: Secondary | ICD-10-CM

## 2016-07-14 LAB — GLUCOSE, CAPILLARY
GLUCOSE-CAPILLARY: 136 mg/dL — AB (ref 65–99)
GLUCOSE-CAPILLARY: 146 mg/dL — AB (ref 65–99)
GLUCOSE-CAPILLARY: 159 mg/dL — AB (ref 65–99)
GLUCOSE-CAPILLARY: 185 mg/dL — AB (ref 65–99)
Glucose-Capillary: 142 mg/dL — ABNORMAL HIGH (ref 65–99)
Glucose-Capillary: 180 mg/dL — ABNORMAL HIGH (ref 65–99)
Glucose-Capillary: 121 mg/dL — ABNORMAL HIGH (ref 65–99)
Glucose-Capillary: 176 mg/dL — ABNORMAL HIGH (ref 65–99)
Glucose-Capillary: 157 mg/dL — ABNORMAL HIGH (ref 65–99)

## 2016-07-14 LAB — BASIC METABOLIC PANEL
Anion gap: 5 (ref 5–15)
BUN: 24 mg/dL — AB (ref 6–20)
CALCIUM: 8.4 mg/dL — AB (ref 8.9–10.3)
CHLORIDE: 108 mmol/L (ref 101–111)
CO2: 29 mmol/L (ref 22–32)
CREATININE: 0.38 mg/dL — AB (ref 0.61–1.24)
GFR calc non Af Amer: >60 mL/min (ref >60)
Glucose, Bld: 145 mg/dL — ABNORMAL HIGH (ref 65–99)
Potassium: 3.8 mmol/L (ref 3.5–5.1)
SODIUM: 142 mmol/L (ref 135–145)

## 2016-07-14 LAB — CBC
HEMATOCRIT: 30.4 % — AB (ref 39.0–52.0)
Hemoglobin: 9 g/dL — ABNORMAL LOW (ref 13.0–17.0)
MCH: 25.1 pg — ABNORMAL LOW (ref 26.0–34.0)
MCHC: 29.6 g/dL — ABNORMAL LOW (ref 30.0–36.0)
MCV: 84.9 fL (ref 78.0–100.0)
PLATELETS: 416 10*3/uL — AB (ref 150–400)
RBC: 3.58 MIL/uL — ABNORMAL LOW (ref 4.22–5.81)
RDW: 18.3 % — AB (ref 11.5–15.5)
WBC: 12.2 10*3/uL — AB (ref 4.0–10.5)

## 2016-07-14 LAB — MAGNESIUM: MAGNESIUM: 2 mg/dL (ref 1.7–2.4)

## 2016-07-14 MED ORDER — DEXTROSE 5 % IV SOLN
1.0000 g | INTRAVENOUS | Status: DC
  Administered 2016-07-14: 1 g via INTRAVENOUS
  Filled 2016-07-14 (×2): qty 10

## 2016-07-14 MED ORDER — FUROSEMIDE 10 MG/ML IJ SOLN
40.0000 mg | Freq: Once | INTRAMUSCULAR | Status: AC
  Administered 2016-07-14: 40 mg via INTRAVENOUS
  Filled 2016-07-14: qty 4
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
## 2016-07-14 NOTE — Progress Notes (Signed)
Inpatient Rehabilitation  Continuing to follow along for appropriateness for IP Rehab.  Note that patient is now off vent and tolerating trach collar.  OT, PT, and SLP are continuing to recommend IP Rehab; consult order re-placed.  Plan to follow along for timing of medical readiness, therapy tolerance, insurance authorization, and bed availability.  Please call with questions.   Charlane FerrettiMelissa Timonthy Hovater, M.A., CCC/SLP Admission Coordinator  [facility] Inpatient Rehabilitation  Cell [phone]
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
## 2016-07-14 NOTE — Progress Notes (Signed)
*  PRELIMINARY RESULTS* Vascular Ultrasound Left upper extremity venous duplex has been completed.  Preliminary findings: No evidence of DVT or superficial thrombosis.  Farrel DemarkJill Eunice, RDMS, RVT  07/14/2016, 12:23 PM
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
## 2016-07-15 ENCOUNTER — Inpatient Hospital Stay (HOSPITAL_COMMUNITY): Payer: Medicare Other

## 2016-07-15 LAB — GLUCOSE, CAPILLARY
GLUCOSE-CAPILLARY: 154 mg/dL — AB (ref 65–99)
GLUCOSE-CAPILLARY: 112 mg/dL — AB (ref 65–99)
GLUCOSE-CAPILLARY: 193 mg/dL — AB (ref 65–99)
Glucose-Capillary: 145 mg/dL — ABNORMAL HIGH (ref 65–99)
Glucose-Capillary: 106 mg/dL — ABNORMAL HIGH (ref 65–99)
Glucose-Capillary: 140 mg/dL — ABNORMAL HIGH (ref 65–99)

## 2016-07-15 LAB — BASIC METABOLIC PANEL
ANION GAP: 8 (ref 5–15)
BUN: 24 mg/dL — AB (ref 6–20)
CALCIUM: 8.6 mg/dL — AB (ref 8.9–10.3)
CO2: 29 mmol/L (ref 22–32)
Chloride: 106 mmol/L (ref 101–111)
Creatinine, Ser: 0.35 mg/dL — ABNORMAL LOW (ref 0.61–1.24)
GFR calc Af Amer: >60 mL/min (ref >60)
GLUCOSE: 145 mg/dL — AB (ref 65–99)
Potassium: 4.4 mmol/L (ref 3.5–5.1)
SODIUM: 143 mmol/L (ref 135–145)

## 2016-07-15 LAB — CBC
HCT: 33.7 % — ABNORMAL LOW (ref 39.0–52.0)
Hemoglobin: 10 g/dL — ABNORMAL LOW (ref 13.0–17.0)
MCH: 25.4 pg — ABNORMAL LOW (ref 26.0–34.0)
MCHC: 29.7 g/dL — AB (ref 30.0–36.0)
MCV: 85.5 fL (ref 78.0–100.0)
Platelets: 468 10*3/uL — ABNORMAL HIGH (ref 150–400)
RBC: 3.94 MIL/uL — ABNORMAL LOW (ref 4.22–5.81)
RDW: 18.1 % — AB (ref 11.5–15.5)
WBC: 10.4 10*3/uL (ref 4.0–10.5)

## 2016-07-15 MED ORDER — LEVOFLOXACIN 750 MG PO TABS
750.0000 mg | ORAL_TABLET | Freq: Every day | ORAL | Status: DC
  Administered 2016-07-15 – 2016-07-17 (×3): 750 mg via ORAL
  Filled 2016-07-15 (×3): qty 1

## 2016-07-15 NOTE — Progress Notes (Signed)
Desats to 80's, tracheal suction done with minimal  Secretions. Resp. Therapist aware, placed on vent. o2 sats-97% continue to monitor.
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
## 2016-07-15 NOTE — Progress Notes (Signed)
RN notified RT of patient in respiratory distress. Oxygen saturations in upper 70's low 80's. Patient placed on ventilator with 8cc tidal volume, RR of 14, 70% of 02, and 5.0 of peep. Patient tolerating well at this time. Patient states he feels some better after being placed on vent. RN aware. RT will continue to monitor.

## 2016-07-16 ENCOUNTER — Inpatient Hospital Stay (HOSPITAL_COMMUNITY): Payer: Medicare Other

## 2016-07-16 LAB — GLUCOSE, CAPILLARY
GLUCOSE-CAPILLARY: 130 mg/dL — AB (ref 65–99)
GLUCOSE-CAPILLARY: 156 mg/dL — AB (ref 65–99)
Glucose-Capillary: 143 mg/dL — ABNORMAL HIGH (ref 65–99)
Glucose-Capillary: 160 mg/dL — ABNORMAL HIGH (ref 65–99)
Glucose-Capillary: 193 mg/dL — ABNORMAL HIGH (ref 65–99)
Glucose-Capillary: 195 mg/dL — ABNORMAL HIGH (ref 65–99)
Glucose-Capillary: 207 mg/dL — ABNORMAL HIGH (ref 65–99)

## 2016-07-16 MED ORDER — FREE WATER
60.0000 mL | Status: DC
  Administered 2016-07-16 – 2016-07-19 (×19): 60 mL
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
## 2016-07-17 LAB — CBC
HEMATOCRIT: 29.1 % — AB (ref 39.0–52.0)
Hemoglobin: 8.8 g/dL — ABNORMAL LOW (ref 13.0–17.0)
MCH: 25.4 pg — AB (ref 26.0–34.0)
MCHC: 30.2 g/dL (ref 30.0–36.0)
MCV: 84.1 fL (ref 78.0–100.0)
Platelets: 447 10*3/uL — ABNORMAL HIGH (ref 150–400)
RBC: 3.46 MIL/uL — ABNORMAL LOW (ref 4.22–5.81)
RDW: 19.1 % — AB (ref 11.5–15.5)
WBC: 13.2 10*3/uL — ABNORMAL HIGH (ref 4.0–10.5)

## 2016-07-17 LAB — BASIC METABOLIC PANEL
Anion gap: 7 (ref 5–15)
BUN: 28 mg/dL — ABNORMAL HIGH (ref 6–20)
CALCIUM: 8.5 mg/dL — AB (ref 8.9–10.3)
CHLORIDE: 110 mmol/L (ref 101–111)
CO2: 26 mmol/L (ref 22–32)
CREATININE: 0.34 mg/dL — AB (ref 0.61–1.24)
GFR calc Af Amer: >60 mL/min (ref >60)
GFR calc non Af Amer: >60 mL/min (ref >60)
Glucose, Bld: 187 mg/dL — ABNORMAL HIGH (ref 65–99)
Potassium: 3.9 mmol/L (ref 3.5–5.1)
Sodium: 143 mmol/L (ref 135–145)

## 2016-07-17 LAB — GLUCOSE, CAPILLARY
Glucose-Capillary: 191 mg/dL — ABNORMAL HIGH (ref 65–99)
Glucose-Capillary: 175 mg/dL — ABNORMAL HIGH (ref 65–99)
Glucose-Capillary: 183 mg/dL — ABNORMAL HIGH (ref 65–99)
Glucose-Capillary: 146 mg/dL — ABNORMAL HIGH (ref 65–99)
Glucose-Capillary: 165 mg/dL — ABNORMAL HIGH (ref 65–99)
Glucose-Capillary: 192 mg/dL — ABNORMAL HIGH (ref 65–99)

## 2016-07-17 MED ORDER — IPRATROPIUM-ALBUTEROL 0.5-2.5 (3) MG/3ML IN SOLN
3.0000 mL | Freq: Four times a day (QID) | RESPIRATORY_TRACT | Status: DC
  Administered 2016-07-17 (×2): 3 mL via RESPIRATORY_TRACT
  Filled 2016-07-17 (×3): qty 3

## 2016-07-17 MED ORDER — LEVOFLOXACIN IN D5W 750 MG/150ML IV SOLN: 750.0000 mg | INTRAVENOUS | Status: DC

## 2016-07-17 MED ORDER — LEVOFLOXACIN IN D5W 750 MG/150ML IV SOLN
750.0000 mg | INTRAVENOUS | Status: DC
  Administered 2016-07-17 – 2016-07-19 (×3): 750 mg via INTRAVENOUS
  Filled 2016-07-17 (×3): qty 150
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
## 2016-07-17 NOTE — Progress Notes (Signed)
Patient Demographics:    Samuel Peck, is a 76 y.o. male, DOB - January 06, 1941, UVO:[id number]  Admit date - 07/08/2016   Admitting Physician Rush Farmer, MD  Outpatient Primary MD for the patient is [name], MD  LOS - 9   No chief complaint on file.       Subjective:    Samuel Peck today has no fevers, no emesis,  Wife at bedside, respiratory therapist at bedside trying to wean him off the vent back to trach collar  Assessment  & Plan :    Active Problems:   Pneumonia   Acute on chronic respiratory failure with hypoxemia (HCC)   Aspiration pneumonia of right lower lobe (HCC)   Permanent atrial fibrillation (HCC)   Severe sepsis (HCC)   Protein-calorie malnutrition, severe  Plan:- 1)Acute on chronic hypoxic respiratory failure with HCAP, pulmonary edema, pleural effusions-  Had to go back on Vent  (ATC)on 07/16/16 am  for increased WOB/Tachypnea , trach collar trial on 07/17/16,  Repeat chest x-ray on 07/15/2016 and again on 07/16/16 with worsening left upper lobe and Lt mid Lung infiltrates, c/n Levaquin iv due to poor Gi absorption (started 07/15/16), get Resp Culture As patient is at risk for multidrug resistant organism infection, D/w Dr Elsworth Soho. Trach with trach collar, patient previously completed vancomycin, Zosyn and Rocephin for MRSA and Moraxella pneumonia  2)Recurrent C diff colitis- continue vancomycin through PEG tube started on 07/09/2016  3)Stage II sacral decub (present prior to this admission)- Yeast dermatitis/candida intertrigo , thrush, continue wound care (aquacel/Gel) and Diflucan  4)Hx of A fib, HTN, HLD- - continue eliquis5 mm twice a day, lopressor25 mg twice a day,flecanide100 mg twice a day, lipitor10 mg daily  5)Moderate protein calorie malnutrition- tolerating tube feeding well, replace electrolytes,   6)DM type II- continue Lantus insulin 18 units and  Use Novolog/Humalog Sliding scale insulin with Accu-Cheks/Fingersticks as ordered  7)[name], neurogenic bladder.  8)FEN- c/n Pivot 1.5 Tube feed at 60 ml/hr, start Free Water at 60 ml q 4 hrs  Code Status:Full   Disposition Plan:To be determined, wife is a retired Warden/ranger who intends to take patient home if this is feasible  Consults :Pulmonary and critical care   DVT Prophylaxis: Eliquis    Lab Results  Component Value Date   PLT 447 (H) 07/17/2016    Inpatient Medications  Scheduled Meds: . apixaban  5 mg Oral BID  . atorvastatin  10 mg Per Tube Q supper  . B-complex with vitamin C  1 tablet Oral Daily  . chlorhexidine gluconate (MEDLINE KIT)  15 mL Mouth Rinse BID  . famotidine  20 mg Per Tube BID  . feeding supplement (PRO-STAT SUGAR FREE 64)  30 mL Per Tube Daily  . flecainide  100 mg Per Tube BID  . fluconazole  100 mg Per Tube Daily  . free water  60 mL Per Tube Q4H  . guaiFENesin  10 mL Per Tube Q6H  . insulin aspart  0-20 Units Subcutaneous Q4H  . insulin aspart  3 Units Subcutaneous Q4H  . insulin glargine  18 Units Subcutaneous QHS  . ipratropium-albuterol  3 mL Nebulization Q6H  . levofloxacin (LEVAQUIN) IV  750 mg Intravenous Q24H  .  mouth rinse  15 mL Mouth Rinse 10 times per day  . memantine  10 mg Per Tube BID  . metoprolol tartrate  25 mg Per Tube BID  . neomycin-bacitracin-polymyxin  1 application Topical Daily  . vitamin B-6  25 mg Oral Daily  . vancomycin  125 mg Oral QID   Followed by  . [START ON 07/23/2016] vancomycin  125 mg Oral BID   Followed by  . [START ON 07/30/2016] vancomycin  125 mg Oral Daily   Followed by  . [START ON 08/06/2016] vancomycin  125 mg Oral QODAY   Followed by  . [START ON 08/14/2016] vancomycin  125 mg Oral Q3 days  . zinc sulfate  220 mg Oral Daily   Continuous Infusions: . feeding supplement (PIVOT 1.5 CAL) 1,000 mL (07/17/16 0000)   PRN Meds:.sodium chloride,  acetaminophen, albuterol, clonazePAM, ondansetron (ZOFRAN) IV, oxyCODONE    Anti-infectives    Start     Dose/Rate Route Frequency Ordered Stop   08/14/16 1000  vancomycin (VANCOCIN) 50 mg/mL oral solution 125 mg     125 mg Oral Every 3 DAYS 07/09/16 1019 08/29/16 0959   08/06/16 1000  vancomycin (VANCOCIN) 50 mg/mL oral solution 125 mg     125 mg Oral Every other day 07/09/16 1019 08/14/16 0959   07/30/16 1000  vancomycin (VANCOCIN) 50 mg/mL oral solution 125 mg     125 mg Oral Daily 07/09/16 1019 08/06/16 0959   07/23/16 1000  vancomycin (VANCOCIN) 50 mg/mL oral solution 125 mg     125 mg Oral 2 times daily 07/09/16 1019 07/30/16 0959   07/17/16 1115  levofloxacin (LEVAQUIN) IVPB 750 mg     750 mg 100 mL/hr over 90 Minutes Intravenous Every 24 hours 07/17/16 1110     07/15/16 1845  levofloxacin (LEVAQUIN) tablet 750 mg  Status:  Discontinued     750 mg Oral Daily 07/15/16 1843 07/17/16 1110   07/14/16 1200  cefTRIAXone (ROCEPHIN) 1 g in dextrose 5 % 50 mL IVPB  Status:  Discontinued     1 g 100 mL/hr over 30 Minutes Intravenous Every 24 hours 07/14/16 1142 07/15/16 1055   07/11/16 1530  vancomycin (VANCOCIN) 1,250 mg in sodium chloride 0.9 % 250 mL IVPB  Status:  Discontinued     1,250 mg 166.7 mL/hr over 90 Minutes Intravenous Every 12 hours 07/11/16 1115 07/14/16 1142   07/11/16 1200  fluconazole (DIFLUCAN) 40 MG/ML suspension 100 mg     100 mg Per Tube Daily 07/11/16 1046     07/11/16 1200  cefTRIAXone (ROCEPHIN) 1 g in dextrose 5 % 50 mL IVPB  Status:  Discontinued     1 g 100 mL/hr over 30 Minutes Intravenous Every 24 hours 07/11/16 1050 07/14/16 1142   07/09/16 1045  vancomycin (VANCOCIN) 50 mg/mL oral solution 125 mg     125 mg Oral 4 times daily 07/09/16 1019 07/23/16 0959   07/08/16 1700  fluconazole (DIFLUCAN) 40 MG/ML suspension 100 mg     100 mg Per Tube Daily 07/08/16 1609 07/10/16 1038   07/08/16 1530  vancomycin (VANCOCIN) IVPB 1000 mg/200 mL premix  Status:   Discontinued     1,000 mg 200 mL/hr over 60 Minutes Intravenous Every 12 hours 07/08/16 1514 07/11/16 1115   07/08/16 1515  piperacillin-tazobactam (ZOSYN) IVPB 3.375 g  Status:  Discontinued     3.375 g 12.5 mL/hr over 240 Minutes Intravenous Every 8 hours 07/08/16 1514 07/11/16 1050  Objective:   Vitals:   07/17/16 0757 07/17/16 0800 07/17/16 1057 07/17/16 1058  BP: (!) 122/51 (!) 130/54  (!) 130/54  Pulse: 88 86    Resp: (!) 23 20    Temp: 97.6 F (36.4 C)     TempSrc: Oral     SpO2: 100% 100% 100%   Weight:      Height:        Wt Readings from Last 3 Encounters:  07/17/16 82.3 kg (181 lb 8 oz)  07/08/16 85 kg (187 lb 6.3 oz)     Intake/Output Summary (Last 24 hours) at 07/17/16 1113 Last data filed at 07/17/16 0800  Gross per 24 hour  Intake              800 ml  Output             1475 ml  Net             -675 ml     Physical Exam  Gen:- Awake Alert, Following commands  HEENT:- [place].AT, No sclera icterus Neck- Trach site is clean dry and intact  Lungs- scattered rhonchi CV- S1, S2 normal Abd-  +ve B.Sounds, Abd Soft, No tenderness,   PEG tube site is clean  Extremity/Skin:-Sacral decubitus   Data Review:   Micro Results Recent Results (from the past 240 hour(s))  Culture, expectorated sputum-assessment     Status: None   Collection Time: 07/08/16 10:47 AM  Result Value Ref Range Status   Specimen Description EXPECTORATED SPUTUM  Final   Special Requests NONE  Final   Sputum evaluation THIS SPECIMEN IS ACCEPTABLE FOR SPUTUM CULTURE  Final   Report Status 07/08/2016 FINAL  Final  Culture, respiratory (NON-Expectorated)     Status: None   Collection Time: 07/08/16 10:47 AM  Result Value Ref Range Status   Specimen Description EXPECTORATED SPUTUM  Final   Special Requests NONE Reflexed from [id number]  Final   Gram Stain   Final    FEW WBC PRESENT, PREDOMINANTLY PMN FEW SQUAMOUS EPITHELIAL CELLS PRESENT ABUNDANT GRAM POSITIVE COCCI IN  PAIRS ABUNDANT GRAM NEGATIVE COCCOBACILLI    Culture   Final    MODERATE METHICILLIN RESISTANT STAPHYLOCOCCUS AUREUS MODERATE MORAXELLA CATARRHALIS(BRANHAMELLA) BETA LACTAMASE POSITIVE    Report Status 07/11/2016 FINAL  Final   Organism ID, Bacteria METHICILLIN RESISTANT STAPHYLOCOCCUS AUREUS  Final      Susceptibility   Methicillin resistant staphylococcus aureus - MIC*    CIPROFLOXACIN >=8 RESISTANT Resistant     ERYTHROMYCIN >=8 RESISTANT Resistant     GENTAMICIN <=0.5 SENSITIVE Sensitive     OXACILLIN >=4 RESISTANT Resistant     TETRACYCLINE >=16 RESISTANT Resistant     VANCOMYCIN 1 SENSITIVE Sensitive     TRIMETH/SULFA <=10 SENSITIVE Sensitive     CLINDAMYCIN <=0.25 SENSITIVE Sensitive     RIFAMPIN <=0.5 SENSITIVE Sensitive     Inducible Clindamycin NEGATIVE Sensitive     * MODERATE METHICILLIN RESISTANT STAPHYLOCOCCUS AUREUS  Culture, blood (single)     Status: None   Collection Time: 07/08/16 10:51 AM  Result Value Ref Range Status   Specimen Description BLOOD RIGHT ARM  Final   Special Requests BOTTLES DRAWN AEROBIC AND ANAEROBIC 5CC EA  Final   Culture NO GROWTH 5 DAYS  Final   Report Status 07/13/2016 FINAL  Final  Culture, blood (single)     Status: None   Collection Time: 07/08/16 11:15 AM  Result Value Ref Range Status   Specimen Description BLOOD LEFT HAND  Final   Special Requests IN PEDIATRIC BOTTLE [place]  Final   Culture NO GROWTH 5 DAYS  Final   Report Status 07/13/2016 FINAL  Final  Urine culture     Status: Abnormal   Collection Time: 07/08/16 12:21 PM  Result Value Ref Range Status   Specimen Description URINE, RANDOM  Final   Special Requests NONE  Final   Culture MULTIPLE SPECIES PRESENT, SUGGEST RECOLLECTION (A)  Final   Report Status 07/09/2016 FINAL  Final  MRSA PCR Screening     Status: None   Collection Time: 07/08/16  1:25 PM  Result Value Ref Range Status   MRSA by PCR NEGATIVE NEGATIVE Final    Comment:        The GeneXpert MRSA Assay  (FDA approved for NASAL specimens only), is one component of a comprehensive MRSA colonization surveillance program. It is not intended to diagnose MRSA infection nor to guide or monitor treatment for MRSA infections.   Culture, blood (routine x 2)     Status: None   Collection Time: 07/08/16  4:00 PM  Result Value Ref Range Status   Specimen Description BLOOD RIGHT HAND  Final   Special Requests BOTTLES DRAWN AEROBIC ONLY 5CC  Final   Culture NO GROWTH 5 DAYS  Final   Report Status 07/13/2016 FINAL  Final  Culture, blood (routine x 2)     Status: None   Collection Time: 07/08/16  4:05 PM  Result Value Ref Range Status   Specimen Description BLOOD LEFT HAND  Final   Special Requests BOTTLES DRAWN AEROBIC ONLY 10CC  Final   Culture NO GROWTH 5 DAYS  Final   Report Status 07/13/2016 FINAL  Final  C difficile quick screen w PCR reflex     Status: Abnormal   Collection Time: 07/09/16  5:47 AM  Result Value Ref Range Status   C Diff antigen POSITIVE (A) NEGATIVE Final   C Diff toxin POSITIVE (A) NEGATIVE Final   C Diff interpretation Toxin producing C. difficile detected.  Final    Comment: CRITICAL RESULT CALLED TO, READ BACK BY AND VERIFIED WITH: K STANLEY,RN @0641  07/09/16 MKELLY,MLT     Radiology Reports Dg Chest 2 View  Result Date: 07/05/2016 CLINICAL DATA:  Increased sputum production EXAM: CHEST  2 VIEW COMPARISON:  06/29/2016 FINDINGS: Cardiac shadow is stable. Postoperative changes and tracheostomy tube are again seen and stable. The bibasilar infiltrates seen on the prior exam are stable in appearance. No new focal infiltrate is noted. No acute bony abnormality is seen. IMPRESSION: Stable bibasilar infiltrates. Electronically Signed   By: Inez Catalina M.D.   On: 07/05/2016 14:07   Dg Chest 2 View  Result Date: 06/27/2016 CLINICAL DATA:  SOB, with trach tube. patient could not explain complaints. Patient's mattress prevented patient from leaning forward for lateral  view. EXAM: CHEST - 2 VIEW COMPARISON:  06/20/2016 FINDINGS: Progressive consolidation in the left lower lung. New patchy airspace disease in the left mid lung. Stable airspace disease at the right lung base. Heart size and mediastinal contours are within normal limits. Can't exclude small left pleural effusion. Upper thoracic fixation hardware as before. IVC filter partially visualized. IMPRESSION: 1. Worsening airspace disease in the left mid and lower lung with possible left effusion. Electronically Signed   By: Lucrezia Europe M.D.   On: 06/27/2016 19:53   [place] Chest  Result Date: 06/29/2016 CLINICAL DATA:  Assess LEFT pleural effusion. EXAM: CHEST ULTRASOUND COMPARISON:  Chest radiograph July 07, 2016  FINDINGS: No sonographic evidence of LEFT pleural effusion, no indicated pleurocentesis. IMPRESSION: No LEFT pleural effusion by sonogram. Electronically Signed   By: Elon Alas M.D.   On: 06/29/2016 14:43   Dg Chest Port 1 View  Result Date: 07/16/2016 CLINICAL DATA:  Acute on chronic respiratory failure. Subsequent encounter. EXAM: PORTABLE CHEST 1 VIEW COMPARISON:  07/15/2016 and older exams. FINDINGS: Persistent lung base opacities, left greater than right. There is mild hazy airspace opacity in the left mid lung, also stable from the most recent prior exam. No new lung abnormalities. Probable small pleural effusions.  No pneumothorax. Tracheostomy tube is stable and well positioned. IMPRESSION: 1. No significant change from the most recent prior exam. 2. Left mid lung hazy airspace consolidation is stable. Lung base opacity, at least in part atelectasis, is also stable. Small stable effusions. Findings may reflect left lung pneumonia. Asymmetric edema is possible. Electronically Signed   By: Lajean Manes M.D.   On: 07/16/2016 07:42   Dg Chest Port 1 View  Result Date: 07/15/2016 CLINICAL DATA:  Tracheostomy. EXAM: PORTABLE CHEST 1 VIEW COMPARISON:  07/14/2016. FINDINGS: Tracheostomy tube in  stable position. Heart size stable. Progression of left mid/ upper lung infiltrate. Persistent bibasilar infiltrates. Persistent bibasilar atelectasis. Small bilateral pleural effusions cannot be excluded. Stable left apical pleural thickening. No pneumothorax. Prior cervicothoracic spine fusion. IMPRESSION: 1. Tracheostomy tube in stable position. 2. Progressive left mid/ upper lung infiltrate. Persistent bibasilar infiltrates. Persistent bibasilar atelectasis. Small bilateral pleural effusions. Electronically Signed   By: Marcello Moores  Register   On: 07/15/2016 06:55   Dg Chest Port 1 View  Result Date: 07/14/2016 CLINICAL DATA:  Pneumonia and shortness of breath. Acute on chronic respiratory failure, sepsis. EXAM: PORTABLE CHEST 1 VIEW COMPARISON:  Portable chest x-ray of July 13, 2016 FINDINGS: The lungs are well-expanded. There remain bilateral pleural effusions obscuring the costophrenic angles. Bibasilar densities persist. Patchy density in the left upper lobe is more conspicuous today. The heart is top-normal in size. The pulmonary vascularity is normal. The tracheostomy appliance tip lies at the level of the superior margin of the clavicular heads. The patient has undergone posterior fusion of multiple upper thoracic vertebral levels. IMPRESSION: Slight interval increase in density in the left upper lobe worrisome for developing atelectasis or pneumonia. Persistent bibasilar atelectasis or pneumonia with small bilateral pleural effusions. Electronically Signed   By: David  Martinique M.D.   On: 07/14/2016 06:40   Dg Chest Port 1 View  Result Date: 07/13/2016 CLINICAL DATA:  Ventilation. EXAM: PORTABLE CHEST 1 VIEW COMPARISON:  07/12/2016.  07/11/2016. FINDINGS: Tracheostomy tube in stable position. Persistent bibasilar pulmonary infiltrates and bilateral pleural effusions noted, no change. Heart size stable. No pleural effusion or pneumothorax. Thoracic spine fusion. IMPRESSION: Unchanged bibasilar  infiltrates and small bilateral pleural effusions. Electronically Signed   By: Marcello Moores  Register   On: 07/13/2016 07:09   Dg Chest Port 1 View  Result Date: 07/12/2016 CLINICAL DATA:  Respiratory failure and shortness of breath. EXAM: PORTABLE CHEST 1 VIEW COMPARISON:  One-view chest.  07/11/2016 FINDINGS: Tracheostomy tube is stable. The heart is mildly enlarged. Bilateral pleural effusions and basilar airspace disease are unchanged. Fluid in the right minor fissure is slightly decreased. Remote left-sided rib fractures are again noted. IMPRESSION: 1. Stable appearance of borderline cardiomegaly with bilateral effusions and basilar airspace disease concerning for pneumonia. 2. Tracheostomy tube is stable. Electronically Signed   By: San Morelle M.D.   On: 07/12/2016 06:40   Dg Chest Port 1  View  Result Date: 07/11/2016 CLINICAL DATA:  76 year old male with respiratory failure. Initial encounter. EXAM: PORTABLE CHEST 1 VIEW COMPARISON:  07/10/2016 and earlier. FINDINGS: Portable AP semi upright view at 0602 hours. Continued stable lung volumes. Stable cardiac size and mediastinal contours. Extensive upper thoracic spinal hardware. Tracheostomy tube remains in place. No pneumothorax. Pulmonary vascularity has diminished since 07/09/2016. Patchy and confluent bibasilar opacity remains unchanged. No areas of worsening ventilation. IMPRESSION: Resolved pulmonary vascular congestion since 07/09/2016 but otherwise stable confluent bibasilar opacity favored to reflect pneumonia with possible small pleural effusions. Electronically Signed   By: Genevie Ann M.D.   On: 07/11/2016 07:36   Dg Chest Port 1 View  Result Date: 07/10/2016 CLINICAL DATA:  Health care associated pneumonia. EXAM: PORTABLE CHEST 1 VIEW COMPARISON:  Radiographs of July 09, 2016. FINDINGS: Stable cardiomediastinal silhouette. No pneumothorax is noted. Status post surgical posterior fusion of upper thoracic spine. Stable bilateral rib  fractures are noted. Stable bibasilar opacities are noted concerning for edema or atelectasis with associated pleural effusions. IMPRESSION: Stable bilateral rib fractures. Stable bibasilar opacities as noted above. Electronically Signed   By: Marijo Conception, M.D.   On: 07/10/2016 07:45   Dg Chest Port 1 View  Result Date: 07/09/2016 CLINICAL DATA:  76 year old male respiratory failure with bilateral lower lobe pulmonary opacities. Initial encounter. EXAM: PORTABLE CHEST 1 VIEW COMPARISON:  07/08/2016 and earlier. FINDINGS: Portable AP semi upright view at 0523 hours. Multilevel upper thoracic posterior spinal fusion hardware. Tracheostomy tube in place. Stable lung volumes. Stable cardiac size and mediastinal contours. Continued confluent bibasilar pulmonary opacity. That at the right lung base has progressed since 06/29/2016, but is mildly improved since yesterday. No superimposed pneumothorax or pulmonary edema. Small pleural effusions are possible. IMPRESSION: 1. Continued confluent bibasilar opacity. Mildly improved ventilation at the right lung base since yesterday. Favor bilateral pneumonia. Probable associated small effusions. 2. No new cardiopulmonary abnormality. Electronically Signed   By: Genevie Ann M.D.   On: 07/09/2016 06:42   Dg Chest Port 1 View  Result Date: 07/08/2016 CLINICAL DATA:  Shortness of Breath EXAM: PORTABLE CHEST 1 VIEW COMPARISON:  July 05, 2016 FINDINGS: There are bilateral pleural effusions. There is patchy airspace consolidation in both lung bases. There is mild cardiomegaly with pulmonary vascularity within normal limits. No adenopathy. There is postoperative change in the upper to mid thoracic region. There is atherosclerotic calcification in the aorta. Tracheostomy catheter tip is 6.8 cm above the carina. No pneumothorax. IMPRESSION: Airspace consolidation in both lower lobes with small pleural effusions bilaterally. Suspect bibasilar pneumonia, although there may be a  degree of underlying congestive heart failure. Aortic atherosclerosis. Electronically Signed   By: Lowella Grip III M.D.   On: 07/08/2016 08:58   Dg Chest Port 1 View  Result Date: 06/29/2016 CLINICAL DATA:  Follow-up pleural effusion. Tracheostomy patient. 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 EXAM: PORTABLE CHEST 1 VIEW COMPARISON:  Chest x-ray of June 27, 2016 FINDINGS: The lungs are adequately inflated. The interstitial markings are increased bilaterally but not greatly changed. The left hemidiaphragm remains obscured. The cardiac silhouette is top-normal in size. The pulmonary vascularity is normal. There is calcification in the wall of the aortic arch. The tracheostomy appliance tip lies between the clavicular heads. The patient has undergone posterior fusion from T2 through T8. IMPRESSION: Stable appearance of the increased interstitial markings bilaterally consistent with pneumonia. Electronically Signed   By: David  Martinique M.D.  On: 06/29/2016 08:43   Dg Chest Port 1 View  Result Date: 06/20/2016 CLINICAL DATA:  Short of breath EXAM: PORTABLE CHEST 1 VIEW FINDINGS: Normal cardiac silhouette. Posterior thoracic fusion. Mild airspace disease in the lateral RIGHT lower lobe is to similar to comparison exam. Improved airspace disease in the LEFT lower lobe. Remote LEFT RIGHT rib fractures no pneumothorax. IMPRESSION: 1. Fine airspace disease in the RIGHT lower lobe suggesting edema or pneumonia. 2. Improved LEFT lower lobe airspace disease. Electronically Signed   By: Suzy Bouchard M.D.   On: 06/20/2016 18:24     CBC  Recent Labs Lab 07/12/16 0220 07/13/16 0406 07/14/16 0229 07/15/16 0741 07/17/16 0211  WBC 12.1* 12.9* 12.2* 10.4 13.2*  HGB 9.2* 9.1* 9.0* 10.0* 8.8*  HCT 30.2* 30.4* 30.4* 33.7* 29.1*  PLT 313 373 416* 468* 447*  MCV 82.7 84.2 84.9 85.5 84.1  MCH 25.2* 25.2* 25.1* 25.4* 25.4*  MCHC 30.5 29.9* 29.6* 29.7* 30.2  RDW  18.2* 18.2* 18.3* 18.1* 19.1*    Chemistries   Recent Labs Lab 07/10/16 1448  07/12/16 0220 07/13/16 0406 07/13/16 1208 07/14/16 0229 07/15/16 0741 07/17/16 0211  NA  --   < > 138 140  --  142 143 143  K  --   < > 4.3 4.6  --  3.8 4.4 3.9  CL  --   < > 105 108  --  108 106 110  CO2  --   < > 25 26  --  29 29 26   GLUCOSE  --   < > 106* 179*  --  145* 145* 187*  BUN  --   < > 20 22*  --  24* 24* 28*  CREATININE  --   < > 0.31* 0.36*  --  0.38* 0.35* 0.34*  CALCIUM  --   < > 8.3* 8.4*  --  8.4* 8.6* 8.5*  MG 2.1  --   --   --  1.9 2.0  --   --   < > = values in this interval not displayed. ------------------------------------------------------------------------------------------------------------------ No results for input(s): CHOL, HDL, LDLCALC, TRIG, CHOLHDL, LDLDIRECT in the last 72 hours.  Lab Results  Component Value Date   HGBA1C 6.4 (H) 06/25/2016   ------------------------------------------------------------------------------------------------------------------ No results for input(s): TSH, T4TOTAL, T3FREE, THYROIDAB in the last 72 hours.  Invalid input(s): FREET3 ------------------------------------------------------------------------------------------------------------------ No results for input(s): VITAMINB12, FOLATE, FERRITIN, TIBC, IRON, RETICCTPCT in the last 72 hours.  Coagulation profile No results for input(s): INR, PROTIME in the last 168 hours.  No results for input(s): DDIMER in the last 72 hours.  Cardiac Enzymes No results for input(s): CKMB, TROPONINI, MYOGLOBIN in the last 168 hours.  Invalid input(s): CK ------------------------------------------------------------------------------------------------------------------ No results found for: BNP   Aubra Pappalardo M.D on 07/17/2016 at 11:13 AM  Between 7am to 7pm - Pager - [phone]  After 7pm go to [email or web address] - password [email or web address]  [facility] -  Office  [phone]  Dragon  dictation system was used to create this note, attempts have been made to correct errors, however presence of uncorrected errors is not a reflection quality of care provided

## 2016-07-18 LAB — GLUCOSE, CAPILLARY
GLUCOSE-CAPILLARY: 120 mg/dL — AB (ref 65–99)
GLUCOSE-CAPILLARY: 122 mg/dL — AB (ref 65–99)
GLUCOSE-CAPILLARY: 129 mg/dL — AB (ref 65–99)
Glucose-Capillary: 159 mg/dL — ABNORMAL HIGH (ref 65–99)
Glucose-Capillary: 164 mg/dL — ABNORMAL HIGH (ref 65–99)
Glucose-Capillary: 145 mg/dL — ABNORMAL HIGH (ref 65–99)

## 2016-07-18 MED ORDER — ORAL CARE MOUTH RINSE
15.0000 mL | Freq: Two times a day (BID) | OROMUCOSAL | Status: DC
  Administered 2016-07-19 – 2016-07-22 (×7): 15 mL via OROMUCOSAL

## 2016-07-18 MED ORDER — IPRATROPIUM-ALBUTEROL 0.5-2.5 (3) MG/3ML IN SOLN
3.0000 mL | Freq: Four times a day (QID) | RESPIRATORY_TRACT | Status: DC
  Administered 2016-07-18 – 2016-07-19 (×7): 3 mL via RESPIRATORY_TRACT
  Filled 2016-07-18 (×7): qty 3

## 2016-07-18 NOTE — Progress Notes (Addendum)
Inpatient Rehabilitation  I "touched base" with Mr. and Mrs Samuel Peck at the bedside.  I note pt's ongoing medical issues. Pt. Back on vent overnight currently on trach collar at this time.  I walked out with Mrs Samuel Peck as she was leaving for the day and mentioned that pt. would likely not be able to tolerate the intensity of CIR as his medical condition stabilizes and would likely need a transitional level of care (?LTACH) before CIR would even be a possibility.  I will continue to follow along at a distance.  Please call if questions.  Weldon PickingSusan Marlow Hendrie PT Inpatient Rehab Admissions Coordinator Cell [phone] Office [phone]

## 2016-07-18 NOTE — Progress Notes (Signed)
Patient Demographics:    Samuel Peck, is a 76 y.o. male, DOB - Nov 09, 1940, OIT:[id number]  Admit date - 07/08/2016   Admitting Physician Rush Farmer, MD  Outpatient Primary MD for the patient is [name], MD  LOS - 10   No chief complaint on file.       Subjective:    Samuel Peck today has no fevers, no emesis,  No chest pain,  Tolerating PEG tube feeding well,  able to tolerate trach collar today   Assessment  & Plan :    Active Problems:   Pneumonia   Acute on chronic respiratory failure with hypoxemia (HCC)   Aspiration pneumonia of right lower lobe (HCC)   Permanent atrial fibrillation (HCC)   Severe sepsis (HCC)   Protein-calorie malnutrition, severe   Plan:- 1)Acute on chronic hypoxic respiratory failure with HCAP, pulmonary edema, pleural effusions- Still requiring ventilatory from time to timefor increased WOB/Tachypnea , trach collar trial on 07/18/16,  Repeat chest x-ray on 07/15/2016 and again on 07/16/16 with worsening left upper lobe and Lt mid Lunginfiltrates, pulmonologist would like to stop Levaquin on 07/18/16 (started 07/15/16),  Resp Culture pending . , patient previously completed vancomycin, Zosyn and Rocephin for MRSA and Moraxella pneumonia, recently treated with Levaquin as above  2)Recurrent C diff colitis- overall improving, less frequent stools, continue vancomycin through PEG tube started on 07/09/2016  3)Stage II sacral decub (present prior to this admission)-   Yeast dermatitis/candida intertrigo , thrush, improved, rash looks better,, continue wound care (aquacel/Gel) and Diflucan  4)Hx of A fib, HTN, HLD- - continue eliquis5 mg twice a day, lopressor25 mg twice a day,flecanide100 mg twice a day, lipitor10 mg daily  5)Moderate protein calorie malnutrition- tolerating tube feeding well, replace electrolytes,   6)DM type II- continue Lantus  insulin 18 units and Use Novolog/Humalog Sliding scale insulin with Accu-Cheks/Fingersticks as ordered  7)[name], neurogenic bladder.  8)FEN- c/n Pivot 1.5 Tube feed at 60 ml/hr, start Free Water at 60 ml q 4 hrs  9)Discharge Planning-  inpatient rehabilitation director does not believe the patient is a candidate for inpatient rehabilitation (CIR) at this time, ??? Candidate for [facility]  Code Status:Full  Disposition Plan:To be determined, wife is a retired ICU Nursewho intends to take patient home if this is feasible, inpatient Conservation officer, historic buildings does not believe the patient is a candidate for inpatient rehabilitation (CIR) at this time, ??? Candidate for [facility]  Consults :Pulmonary and critical care, inpatient rehabilitation consult, respiratory therapist consult,   DVT Prophylaxis: Eliquis    Lab Results  Component Value Date   PLT 447 (H) 07/17/2016    Inpatient Medications  Scheduled Meds: . apixaban  5 mg Oral BID  . atorvastatin  10 mg Per Tube Q supper  . B-complex with vitamin C  1 tablet Oral Daily  . chlorhexidine gluconate (MEDLINE KIT)  15 mL Mouth Rinse BID  . famotidine  20 mg Per Tube BID  . feeding supplement (PRO-STAT SUGAR FREE 64)  30 mL Per Tube Daily  . flecainide  100 mg Per Tube BID  . fluconazole  100 mg Per Tube Daily  . free water  60 mL Per Tube Q4H  . guaiFENesin  10 mL  Per Tube Q6H  . insulin aspart  0-20 Units Subcutaneous Q4H  . insulin aspart  3 Units Subcutaneous Q4H  . insulin glargine  18 Units Subcutaneous QHS  . ipratropium-albuterol  3 mL Nebulization QID  . levofloxacin (LEVAQUIN) IV  750 mg Intravenous Q24H  . mouth rinse  15 mL Mouth Rinse 10 times per day  . memantine  10 mg Per Tube BID  . metoprolol tartrate  25 mg Per Tube BID  . neomycin-bacitracin-polymyxin  1 application Topical Daily  . vitamin B-6  25 mg Oral Daily  . vancomycin  125 mg Oral QID   Followed by  . [START ON  07/23/2016] vancomycin  125 mg Oral BID   Followed by  . [START ON 07/30/2016] vancomycin  125 mg Oral Daily   Followed by  . [START ON 08/06/2016] vancomycin  125 mg Oral QODAY   Followed by  . [START ON 08/14/2016] vancomycin  125 mg Oral Q3 days  . zinc sulfate  220 mg Oral Daily   Continuous Infusions: . feeding supplement (PIVOT 1.5 CAL) 1,000 mL (07/17/16 0000)   PRN Meds:.sodium chloride, acetaminophen, albuterol, clonazePAM, ondansetron (ZOFRAN) IV, oxyCODONE    Anti-infectives    Start     Dose/Rate Route Frequency Ordered Stop   08/14/16 1000  vancomycin (VANCOCIN) 50 mg/mL oral solution 125 mg     125 mg Oral Every 3 DAYS 07/09/16 1019 08/29/16 0959   08/06/16 1000  vancomycin (VANCOCIN) 50 mg/mL oral solution 125 mg     125 mg Oral Every other day 07/09/16 1019 08/14/16 0959   07/30/16 1000  vancomycin (VANCOCIN) 50 mg/mL oral solution 125 mg     125 mg Oral Daily 07/09/16 1019 08/06/16 0959   07/23/16 1000  vancomycin (VANCOCIN) 50 mg/mL oral solution 125 mg     125 mg Oral 2 times daily 07/09/16 1019 07/30/16 0959   07/18/16 1200  levofloxacin (LEVAQUIN) IVPB 750 mg  Status:  Discontinued     750 mg 100 mL/hr over 90 Minutes Intravenous Every 24 hours 07/17/16 1110 07/17/16 1130   07/17/16 2300  levofloxacin (LEVAQUIN) IVPB 750 mg     750 mg 100 mL/hr over 90 Minutes Intravenous Every 24 hours 07/17/16 1130     07/15/16 1845  levofloxacin (LEVAQUIN) tablet 750 mg  Status:  Discontinued     750 mg Oral Daily 07/15/16 1843 07/17/16 1110   07/14/16 1200  cefTRIAXone (ROCEPHIN) 1 g in dextrose 5 % 50 mL IVPB  Status:  Discontinued     1 g 100 mL/hr over 30 Minutes Intravenous Every 24 hours 07/14/16 1142 07/15/16 1055   07/11/16 1530  vancomycin (VANCOCIN) 1,250 mg in sodium chloride 0.9 % 250 mL IVPB  Status:  Discontinued     1,250 mg 166.7 mL/hr over 90 Minutes Intravenous Every 12 hours 07/11/16 1115 07/14/16 1142   07/11/16 1200  fluconazole (DIFLUCAN) 40 MG/ML  suspension 100 mg     100 mg Per Tube Daily 07/11/16 1046     07/11/16 1200  cefTRIAXone (ROCEPHIN) 1 g in dextrose 5 % 50 mL IVPB  Status:  Discontinued     1 g 100 mL/hr over 30 Minutes Intravenous Every 24 hours 07/11/16 1050 07/14/16 1142   07/09/16 1045  vancomycin (VANCOCIN) 50 mg/mL oral solution 125 mg     125 mg Oral 4 times daily 07/09/16 1019 07/23/16 0959   07/08/16 1700  fluconazole (DIFLUCAN) 40 MG/ML suspension 100 mg  100 mg Per Tube Daily 07/08/16 1609 07/10/16 1038   07/08/16 1530  vancomycin (VANCOCIN) IVPB 1000 mg/200 mL premix  Status:  Discontinued     1,000 mg 200 mL/hr over 60 Minutes Intravenous Every 12 hours 07/08/16 1514 07/11/16 1115   07/08/16 1515  piperacillin-tazobactam (ZOSYN) IVPB 3.375 g  Status:  Discontinued     3.375 g 12.5 mL/hr over 240 Minutes Intravenous Every 8 hours 07/08/16 1514 07/11/16 1050        Objective:   Vitals:   07/18/16 0500 07/18/16 0757 07/18/16 0803 07/18/16 0832  BP:    139/62  Pulse:   76 (!) 104  Resp:   (!) 22 (!) 24  Temp:    (!) 96.5 F (35.8 C)  TempSrc:    Axillary  SpO2:  100% 100% 94%  Weight: 80.4 kg (177 lb 3.2 oz)     Height:        Wt Readings from Last 3 Encounters:  07/18/16 80.4 kg (177 lb 3.2 oz)  07/08/16 85 kg (187 lb 6.3 oz)     Intake/Output Summary (Last 24 hours) at 07/18/16 0937 Last data filed at 07/18/16 0600  Gross per 24 hour  Intake          1463.17 ml  Output             2050 ml  Net          -586.83 ml     Physical Exam  Gen:- Awake Alert,  In no apparent distress  HEENT:- [place].AT, No sclera icterus Neck-  trach with trach collar  Lungs- diminished anteriorly with few scattered rhonchi CV- S1, S2 normal, irregular Abd-  +ve B.Sounds, Abd Soft, No tenderness,  PEG tube site is clean dry and intact  Skin:- Warm and dry, Candida intertrigo appears to be improving Extremity- significant neuromuscular deficits unchanged  Data Review:   Micro Results Recent Results (from  the past 240 hour(s))  Culture, expectorated sputum-assessment     Status: None   Collection Time: 07/08/16 10:47 AM  Result Value Ref Range Status   Specimen Description EXPECTORATED SPUTUM  Final   Special Requests NONE  Final   Sputum evaluation THIS SPECIMEN IS ACCEPTABLE FOR SPUTUM CULTURE  Final   Report Status 07/08/2016 FINAL  Final  Culture, respiratory (NON-Expectorated)     Status: None   Collection Time: 07/08/16 10:47 AM  Result Value Ref Range Status   Specimen Description EXPECTORATED SPUTUM  Final   Special Requests NONE Reflexed from [id number]  Final   Gram Stain   Final    FEW WBC PRESENT, PREDOMINANTLY PMN FEW SQUAMOUS EPITHELIAL CELLS PRESENT ABUNDANT GRAM POSITIVE COCCI IN PAIRS ABUNDANT GRAM NEGATIVE COCCOBACILLI    Culture   Final    MODERATE METHICILLIN RESISTANT STAPHYLOCOCCUS AUREUS MODERATE MORAXELLA CATARRHALIS(BRANHAMELLA) BETA LACTAMASE POSITIVE    Report Status 07/11/2016 FINAL  Final   Organism ID, Bacteria METHICILLIN RESISTANT STAPHYLOCOCCUS AUREUS  Final      Susceptibility   Methicillin resistant staphylococcus aureus - MIC*    CIPROFLOXACIN >=8 RESISTANT Resistant     ERYTHROMYCIN >=8 RESISTANT Resistant     GENTAMICIN <=0.5 SENSITIVE Sensitive     OXACILLIN >=4 RESISTANT Resistant     TETRACYCLINE >=16 RESISTANT Resistant     VANCOMYCIN 1 SENSITIVE Sensitive     TRIMETH/SULFA <=10 SENSITIVE Sensitive     CLINDAMYCIN <=0.25 SENSITIVE Sensitive     RIFAMPIN <=0.5 SENSITIVE Sensitive     Inducible Clindamycin NEGATIVE Sensitive     *  MODERATE METHICILLIN RESISTANT STAPHYLOCOCCUS AUREUS  Culture, blood (single)     Status: None   Collection Time: 07/08/16 10:51 AM  Result Value Ref Range Status   Specimen Description BLOOD RIGHT ARM  Final   Special Requests BOTTLES DRAWN AEROBIC AND ANAEROBIC 5CC EA  Final   Culture NO GROWTH 5 DAYS  Final   Report Status 07/13/2016 FINAL  Final  Culture, blood (single)     Status: None   Collection Time:  07/08/16 11:15 AM  Result Value Ref Range Status   Specimen Description BLOOD LEFT HAND  Final   Special Requests IN PEDIATRIC BOTTLE 1CC  Final   Culture NO GROWTH 5 DAYS  Final   Report Status 07/13/2016 FINAL  Final  Urine culture     Status: Abnormal   Collection Time: 07/08/16 12:21 PM  Result Value Ref Range Status   Specimen Description URINE, RANDOM  Final   Special Requests NONE  Final   Culture MULTIPLE SPECIES PRESENT, SUGGEST RECOLLECTION (A)  Final   Report Status 07/09/2016 FINAL  Final  MRSA PCR Screening     Status: None   Collection Time: 07/08/16  1:25 PM  Result Value Ref Range Status   MRSA by PCR NEGATIVE NEGATIVE Final    Comment:        The GeneXpert MRSA Assay (FDA approved for NASAL specimens only), is one component of a comprehensive MRSA colonization surveillance program. It is not intended to diagnose MRSA infection nor to guide or monitor treatment for MRSA infections.   Culture, blood (routine x 2)     Status: None   Collection Time: 07/08/16  4:00 PM  Result Value Ref Range Status   Specimen Description BLOOD RIGHT HAND  Final   Special Requests BOTTLES DRAWN AEROBIC ONLY 5CC  Final   Culture NO GROWTH 5 DAYS  Final   Report Status 07/13/2016 FINAL  Final  Culture, blood (routine x 2)     Status: None   Collection Time: 07/08/16  4:05 PM  Result Value Ref Range Status   Specimen Description BLOOD LEFT HAND  Final   Special Requests BOTTLES DRAWN AEROBIC ONLY 10CC  Final   Culture NO GROWTH 5 DAYS  Final   Report Status 07/13/2016 FINAL  Final  C difficile quick screen w PCR reflex     Status: Abnormal   Collection Time: 07/09/16  5:47 AM  Result Value Ref Range Status   C Diff antigen POSITIVE (A) NEGATIVE Final   C Diff toxin POSITIVE (A) NEGATIVE Final   C Diff interpretation Toxin producing C. difficile detected.  Final    Comment: CRITICAL RESULT CALLED TO, READ BACK BY AND VERIFIED WITH: K STANLEY,RN @0641  07/09/16 MKELLY,MLT      Radiology Reports Dg Chest 2 View  Result Date: 07/05/2016 CLINICAL DATA:  Increased sputum production EXAM: CHEST  2 VIEW COMPARISON:  06/29/2016 FINDINGS: Cardiac shadow is stable. Postoperative changes and tracheostomy tube are again seen and stable. The bibasilar infiltrates seen on the prior exam are stable in appearance. No new focal infiltrate is noted. No acute bony abnormality is seen. IMPRESSION: Stable bibasilar infiltrates. Electronically Signed   By: Inez Catalina M.D.   On: 07/05/2016 14:07   Dg Chest 2 View  Result Date: 06/27/2016 CLINICAL DATA:  SOB, with trach tube. patient could not explain complaints. Patient's mattress prevented patient from leaning forward for lateral view. EXAM: CHEST - 2 VIEW COMPARISON:  06/20/2016 FINDINGS: Progressive consolidation in the left lower  lung. New patchy airspace disease in the left mid lung. Stable airspace disease at the right lung base. Heart size and mediastinal contours are within normal limits. Can't exclude small left pleural effusion. Upper thoracic fixation hardware as before. IVC filter partially visualized. IMPRESSION: 1. Worsening airspace disease in the left mid and lower lung with possible left effusion. Electronically Signed   By: Lucrezia Europe M.D.   On: 06/27/2016 19:53   [place] Chest  Result Date: 06/29/2016 CLINICAL DATA:  Assess LEFT pleural effusion. EXAM: CHEST ULTRASOUND COMPARISON:  Chest radiograph July 07, 2016 FINDINGS: No sonographic evidence of LEFT pleural effusion, no indicated pleurocentesis. IMPRESSION: No LEFT pleural effusion by sonogram. Electronically Signed   By: Elon Alas M.D.   On: 06/29/2016 14:43   Dg Chest Port 1 View  Result Date: 07/16/2016 CLINICAL DATA:  Acute on chronic respiratory failure. Subsequent encounter. EXAM: PORTABLE CHEST 1 VIEW COMPARISON:  07/15/2016 and older exams. FINDINGS: Persistent lung base opacities, left greater than right. There is mild hazy airspace opacity in the  left mid lung, also stable from the most recent prior exam. No new lung abnormalities. Probable small pleural effusions.  No pneumothorax. Tracheostomy tube is stable and well positioned. IMPRESSION: 1. No significant change from the most recent prior exam. 2. Left mid lung hazy airspace consolidation is stable. Lung base opacity, at least in part atelectasis, is also stable. Small stable effusions. Findings may reflect left lung pneumonia. Asymmetric edema is possible. Electronically Signed   By: Lajean Manes M.D.   On: 07/16/2016 07:42   Dg Chest Port 1 View  Result Date: 07/15/2016 CLINICAL DATA:  Tracheostomy. EXAM: PORTABLE CHEST 1 VIEW COMPARISON:  07/14/2016. FINDINGS: Tracheostomy tube in stable position. Heart size stable. Progression of left mid/ upper lung infiltrate. Persistent bibasilar infiltrates. Persistent bibasilar atelectasis. Small bilateral pleural effusions cannot be excluded. Stable left apical pleural thickening. No pneumothorax. Prior cervicothoracic spine fusion. IMPRESSION: 1. Tracheostomy tube in stable position. 2. Progressive left mid/ upper lung infiltrate. Persistent bibasilar infiltrates. Persistent bibasilar atelectasis. Small bilateral pleural effusions. Electronically Signed   By: Marcello Moores  Register   On: 07/15/2016 06:55   Dg Chest Port 1 View  Result Date: 07/14/2016 CLINICAL DATA:  Pneumonia and shortness of breath. Acute on chronic respiratory failure, sepsis. EXAM: PORTABLE CHEST 1 VIEW COMPARISON:  Portable chest x-ray of July 13, 2016 FINDINGS: The lungs are well-expanded. There remain bilateral pleural effusions obscuring the costophrenic angles. Bibasilar densities persist. Patchy density in the left upper lobe is more conspicuous today. The heart is top-normal in size. The pulmonary vascularity is normal. The tracheostomy appliance tip lies at the level of the superior margin of the clavicular heads. The patient has undergone posterior fusion of multiple upper  thoracic vertebral levels. IMPRESSION: Slight interval increase in density in the left upper lobe worrisome for developing atelectasis or pneumonia. Persistent bibasilar atelectasis or pneumonia with small bilateral pleural effusions. Electronically Signed   By: David  Martinique M.D.   On: 07/14/2016 06:40   Dg Chest Port 1 View  Result Date: 07/13/2016 CLINICAL DATA:  Ventilation. EXAM: PORTABLE CHEST 1 VIEW COMPARISON:  07/12/2016.  07/11/2016. FINDINGS: Tracheostomy tube in stable position. Persistent bibasilar pulmonary infiltrates and bilateral pleural effusions noted, no change. Heart size stable. No pleural effusion or pneumothorax. Thoracic spine fusion. IMPRESSION: Unchanged bibasilar infiltrates and small bilateral pleural effusions. Electronically Signed   By: Marcello Moores  Register   On: 07/13/2016 07:09   Dg Chest Port 1 View  Result Date:  07/12/2016 CLINICAL DATA:  Respiratory failure and shortness of breath. EXAM: PORTABLE CHEST 1 VIEW COMPARISON:  One-view chest.  07/11/2016 FINDINGS: Tracheostomy tube is stable. The heart is mildly enlarged. Bilateral pleural effusions and basilar airspace disease are unchanged. Fluid in the right minor fissure is slightly decreased. Remote left-sided rib fractures are again noted. IMPRESSION: 1. Stable appearance of borderline cardiomegaly with bilateral effusions and basilar airspace disease concerning for pneumonia. 2. Tracheostomy tube is stable. Electronically Signed   By: San Morelle M.D.   On: 07/12/2016 06:40   Dg Chest Port 1 View  Result Date: 07/11/2016 CLINICAL DATA:  76 year old male with respiratory failure. Initial encounter. EXAM: PORTABLE CHEST 1 VIEW COMPARISON:  07/10/2016 and earlier. FINDINGS: Portable AP semi upright view at 0602 hours. Continued stable lung volumes. Stable cardiac size and mediastinal contours. Extensive upper thoracic spinal hardware. Tracheostomy tube remains in place. No pneumothorax. Pulmonary vascularity has  diminished since 07/09/2016. Patchy and confluent bibasilar opacity remains unchanged. No areas of worsening ventilation. IMPRESSION: Resolved pulmonary vascular congestion since 07/09/2016 but otherwise stable confluent bibasilar opacity favored to reflect pneumonia with possible small pleural effusions. Electronically Signed   By: Genevie Ann M.D.   On: 07/11/2016 07:36   Dg Chest Port 1 View  Result Date: 07/10/2016 CLINICAL DATA:  Health care associated pneumonia. EXAM: PORTABLE CHEST 1 VIEW COMPARISON:  Radiographs of July 09, 2016. FINDINGS: Stable cardiomediastinal silhouette. No pneumothorax is noted. Status post surgical posterior fusion of upper thoracic spine. Stable bilateral rib fractures are noted. Stable bibasilar opacities are noted concerning for edema or atelectasis with associated pleural effusions. IMPRESSION: Stable bilateral rib fractures. Stable bibasilar opacities as noted above. Electronically Signed   By: Marijo Conception, M.D.   On: 07/10/2016 07:45   Dg Chest Port 1 View  Result Date: 07/09/2016 CLINICAL DATA:  76 year old male respiratory failure with bilateral lower lobe pulmonary opacities. Initial encounter. EXAM: PORTABLE CHEST 1 VIEW COMPARISON:  07/08/2016 and earlier. FINDINGS: Portable AP semi upright view at 0523 hours. Multilevel upper thoracic posterior spinal fusion hardware. Tracheostomy tube in place. Stable lung volumes. Stable cardiac size and mediastinal contours. Continued confluent bibasilar pulmonary opacity. That at the right lung base has progressed since 06/29/2016, but is mildly improved since yesterday. No superimposed pneumothorax or pulmonary edema. Small pleural effusions are possible. IMPRESSION: 1. Continued confluent bibasilar opacity. Mildly improved ventilation at the right lung base since yesterday. Favor bilateral pneumonia. Probable associated small effusions. 2. No new cardiopulmonary abnormality. Electronically Signed   By: Genevie Ann M.D.    On: 07/09/2016 06:42   Dg Chest Port 1 View  Result Date: 07/08/2016 CLINICAL DATA:  Shortness of Breath EXAM: PORTABLE CHEST 1 VIEW COMPARISON:  July 05, 2016 FINDINGS: There are bilateral pleural effusions. There is patchy airspace consolidation in both lung bases. There is mild cardiomegaly with pulmonary vascularity within normal limits. No adenopathy. There is postoperative change in the upper to mid thoracic region. There is atherosclerotic calcification in the aorta. Tracheostomy catheter tip is 6.8 cm above the carina. No pneumothorax. IMPRESSION: Airspace consolidation in both lower lobes with small pleural effusions bilaterally. Suspect bibasilar pneumonia, although there may be a degree of underlying congestive heart failure. Aortic atherosclerosis. Electronically Signed   By: Lowella Grip III M.D.   On: 07/08/2016 08:58   Dg Chest Port 1 View  Result Date: 06/29/2016 CLINICAL DATA:  Follow-up pleural effusion. Tracheostomy patient. 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  EXAM: PORTABLE CHEST 1 VIEW COMPARISON:  Chest x-ray of June 27, 2016 FINDINGS: The lungs are adequately inflated. The interstitial markings are increased bilaterally but not greatly changed. The left hemidiaphragm remains obscured. The cardiac silhouette is top-normal in size. The pulmonary vascularity is normal. There is calcification in the wall of the aortic arch. The tracheostomy appliance tip lies between the clavicular heads. The patient has undergone posterior fusion from T2 through T8. IMPRESSION: Stable appearance of the increased interstitial markings bilaterally consistent with pneumonia. Electronically Signed   By: David  Martinique M.D.   On: 06/29/2016 08:43   Dg Chest Port 1 View  Result Date: 06/20/2016 CLINICAL DATA:  Short of breath EXAM: PORTABLE CHEST 1 VIEW FINDINGS: Normal cardiac silhouette. Posterior thoracic fusion. Mild airspace disease in the  lateral RIGHT lower lobe is to similar to comparison exam. Improved airspace disease in the LEFT lower lobe. Remote LEFT RIGHT rib fractures no pneumothorax. IMPRESSION: 1. Fine airspace disease in the RIGHT lower lobe suggesting edema or pneumonia. 2. Improved LEFT lower lobe airspace disease. Electronically Signed   By: Suzy Bouchard M.D.   On: 06/20/2016 18:24     CBC  Recent Labs Lab 07/12/16 0220 07/13/16 0406 07/14/16 0229 07/15/16 0741 07/17/16 0211  WBC 12.1* 12.9* 12.2* 10.4 13.2*  HGB 9.2* 9.1* 9.0* 10.0* 8.8*  HCT 30.2* 30.4* 30.4* 33.7* 29.1*  PLT 313 373 416* 468* 447*  MCV 82.7 84.2 84.9 85.5 84.1  MCH 25.2* 25.2* 25.1* 25.4* 25.4*  MCHC 30.5 29.9* 29.6* 29.7* 30.2  RDW 18.2* 18.2* 18.3* 18.1* 19.1*    Chemistries   Recent Labs Lab 07/12/16 0220 07/13/16 0406 07/13/16 1208 07/14/16 0229 07/15/16 0741 07/17/16 0211  NA 138 140  --  142 143 143  K 4.3 4.6  --  3.8 4.4 3.9  CL 105 108  --  108 106 110  CO2 25 26  --  29 29 26   GLUCOSE 106* 179*  --  145* 145* 187*  BUN 20 22*  --  24* 24* 28*  CREATININE 0.31* 0.36*  --  0.38* 0.35* 0.34*  CALCIUM 8.3* 8.4*  --  8.4* 8.6* 8.5*  MG  --   --  1.9 2.0  --   --    ------------------------------------------------------------------------------------------------------------------ No results for input(s): CHOL, HDL, LDLCALC, TRIG, CHOLHDL, LDLDIRECT in the last 72 hours.  Lab Results  Component Value Date   HGBA1C 6.4 (H) 06/25/2016   ------------------------------------------------------------------------------------------------------------------ No results for input(s): TSH, T4TOTAL, T3FREE, THYROIDAB in the last 72 hours.  Invalid input(s): FREET3 ------------------------------------------------------------------------------------------------------------------ No results for input(s): VITAMINB12, FOLATE, FERRITIN, TIBC, IRON, RETICCTPCT in the last 72 hours.  Coagulation profile No results for  input(s): INR, PROTIME in the last 168 hours.  No results for input(s): DDIMER in the last 72 hours.  Cardiac Enzymes No results for input(s): CKMB, TROPONINI, MYOGLOBIN in the last 168 hours.  Invalid input(s): CK ------------------------------------------------------------------------------------------------------------------ No results found for: BNP   Alexandera Kuntzman M.D on 07/18/2016 at 9:37 AM  Between 7am to 7pm - Pager - [phone]  After 7pm go to [email or web address] - password [email or web address]  [facility] -  Office  [phone]  Dragon dictation system was used to create this note, attempts have been made to correct errors, however presence of uncorrected errors is not a reflection quality of care provided
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
## 2016-07-18 NOTE — Progress Notes (Signed)
RT note: patient trach water bottle and setup changed without incidence.

## 2016-07-18 NOTE — Progress Notes (Signed)
Patient placed on 40% trach collar.  Currently tolerating well.  Will continue to monitor.  

## 2016-07-19 ENCOUNTER — Inpatient Hospital Stay (HOSPITAL_COMMUNITY): Payer: Medicare Other

## 2016-07-19 DIAGNOSIS — E118 Type 2 diabetes mellitus with unspecified complications: Secondary | ICD-10-CM

## 2016-07-19 DIAGNOSIS — J189 Pneumonia, unspecified organism: Secondary | ICD-10-CM

## 2016-07-19 DIAGNOSIS — Z930 Tracheostomy status: Secondary | ICD-10-CM

## 2016-07-19 DIAGNOSIS — L89153 Pressure ulcer of sacral region, stage 3: Secondary | ICD-10-CM

## 2016-07-19 DIAGNOSIS — N319 Neuromuscular dysfunction of bladder, unspecified: Secondary | ICD-10-CM

## 2016-07-19 DIAGNOSIS — G8222 Paraplegia, incomplete: Secondary | ICD-10-CM

## 2016-07-19 DIAGNOSIS — E440 Moderate protein-calorie malnutrition: Secondary | ICD-10-CM

## 2016-07-19 LAB — CBC
HCT: 32.8 % — ABNORMAL LOW (ref 39.0–52.0)
Hemoglobin: 9.5 g/dL — ABNORMAL LOW (ref 13.0–17.0)
MCH: 24.9 pg — AB (ref 26.0–34.0)
MCHC: 29 g/dL — AB (ref 30.0–36.0)
MCV: 85.9 fL (ref 78.0–100.0)
PLATELETS: 532 10*3/uL — AB (ref 150–400)
RBC: 3.82 MIL/uL — ABNORMAL LOW (ref 4.22–5.81)
RDW: 19 % — ABNORMAL HIGH (ref 11.5–15.5)
WBC: 10.8 10*3/uL — ABNORMAL HIGH (ref 4.0–10.5)

## 2016-07-19 LAB — BASIC METABOLIC PANEL
Anion gap: 7 (ref 5–15)
BUN: 24 mg/dL — AB (ref 6–20)
CO2: 30 mmol/L (ref 22–32)
CREATININE: 0.38 mg/dL — AB (ref 0.61–1.24)
Calcium: 8.8 mg/dL — ABNORMAL LOW (ref 8.9–10.3)
Chloride: 109 mmol/L (ref 101–111)
GFR calc Af Amer: >60 mL/min (ref >60)
Glucose, Bld: 143 mg/dL — ABNORMAL HIGH (ref 65–99)
POTASSIUM: 4.4 mmol/L (ref 3.5–5.1)
SODIUM: 146 mmol/L — AB (ref 135–145)

## 2016-07-19 LAB — GLUCOSE, CAPILLARY
GLUCOSE-CAPILLARY: 135 mg/dL — AB (ref 65–99)
Glucose-Capillary: 141 mg/dL — ABNORMAL HIGH (ref 65–99)
Glucose-Capillary: 145 mg/dL — ABNORMAL HIGH (ref 65–99)
Glucose-Capillary: 128 mg/dL — ABNORMAL HIGH (ref 65–99)
Glucose-Capillary: 112 mg/dL — ABNORMAL HIGH (ref 65–99)
Glucose-Capillary: 163 mg/dL — ABNORMAL HIGH (ref 65–99)

## 2016-07-19 MED ORDER — IPRATROPIUM-ALBUTEROL 0.5-2.5 (3) MG/3ML IN SOLN
3.0000 mL | Freq: Three times a day (TID) | RESPIRATORY_TRACT | Status: DC
  Administered 2016-07-19 – 2016-07-22 (×9): 3 mL via RESPIRATORY_TRACT
  Filled 2016-07-19 (×9): qty 3

## 2016-07-19 MED ORDER — FREE WATER
80.0000 mL | Status: DC
  Administered 2016-07-19 – 2016-07-22 (×18): 80 mL
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
## 2016-07-19 NOTE — Progress Notes (Signed)
CPT done pt tolerated Chest vest well no distress noted.
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
## 2016-07-19 NOTE — Progress Notes (Signed)
Pt is on ATC at this time tolerating it well. Vent not needed at this time no increase WOB noted. Pt is stable.

## 2016-07-20 DIAGNOSIS — E118 Type 2 diabetes mellitus with unspecified complications: Secondary | ICD-10-CM

## 2016-07-20 DIAGNOSIS — I482 Chronic atrial fibrillation: Secondary | ICD-10-CM

## 2016-07-20 DIAGNOSIS — G8222 Paraplegia, incomplete: Secondary | ICD-10-CM

## 2016-07-20 LAB — CULTURE, RESPIRATORY

## 2016-07-20 LAB — BASIC METABOLIC PANEL
Anion gap: 6 (ref 5–15)
BUN: 25 mg/dL — AB (ref 6–20)
CALCIUM: 8.6 mg/dL — AB (ref 8.9–10.3)
CO2: 30 mmol/L (ref 22–32)
Chloride: 108 mmol/L (ref 101–111)
Creatinine, Ser: 0.34 mg/dL — ABNORMAL LOW (ref 0.61–1.24)
GFR calc Af Amer: >60 mL/min (ref >60)
GLUCOSE: 177 mg/dL — AB (ref 65–99)
Potassium: 3.9 mmol/L (ref 3.5–5.1)
Sodium: 144 mmol/L (ref 135–145)

## 2016-07-20 LAB — CULTURE, RESPIRATORY W GRAM STAIN: Special Requests: NORMAL

## 2016-07-20 LAB — GLUCOSE, CAPILLARY
GLUCOSE-CAPILLARY: 108 mg/dL — AB (ref 65–99)
GLUCOSE-CAPILLARY: 112 mg/dL — AB (ref 65–99)
GLUCOSE-CAPILLARY: 135 mg/dL — AB (ref 65–99)
Glucose-Capillary: 105 mg/dL — ABNORMAL HIGH (ref 65–99)
Glucose-Capillary: 141 mg/dL — ABNORMAL HIGH (ref 65–99)
Glucose-Capillary: 155 mg/dL — ABNORMAL HIGH (ref 65–99)
Glucose-Capillary: 183 mg/dL — ABNORMAL HIGH (ref 65–99)

## 2016-07-20 LAB — CBC
HCT: 31.6 % — ABNORMAL LOW (ref 39.0–52.0)
Hemoglobin: 9.3 g/dL — ABNORMAL LOW (ref 13.0–17.0)
MCH: 25.1 pg — AB (ref 26.0–34.0)
MCHC: 29.4 g/dL — AB (ref 30.0–36.0)
MCV: 85.2 fL (ref 78.0–100.0)
PLATELETS: 535 10*3/uL — AB (ref 150–400)
RBC: 3.71 MIL/uL — ABNORMAL LOW (ref 4.22–5.81)
RDW: 18.3 % — AB (ref 11.5–15.5)
WBC: 8.8 10*3/uL (ref 4.0–10.5)

## 2016-07-20 NOTE — Care Management Important Message (Signed)
Important Message  Patient Details  Name: Samuel Peck MRN: [id number] Date of Birth: 02-21-1941   Medicare Important Message Given:  Yes    Hanley HaysDowell, Lennyn Gange T, RN 07/20/2016, 10:18 AM
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
## 2016-07-20 NOTE — Progress Notes (Signed)
CPT done no distress or complications noted. Suctioned pt got back moderate amount of tan/clear thick secretions. SATs are stable 100% on ATC 28%.
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
## 2016-07-20 NOTE — Progress Notes (Addendum)
Have asked both ltac's to look at pt. [facility] looked at pt and says no to ltac. [facility] to look and see what they think. Have made sw ref in case snf needed.[facility] thinks he is approp for their program. If they can get pt approved wife ok w [facility] then poss back to cir if can tol that program. [facility] to work on [employer] manager[employer]. Will cont to follow.
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
## 2016-07-21 ENCOUNTER — Inpatient Hospital Stay (HOSPITAL_COMMUNITY): Payer: Medicare Other

## 2016-07-21 DIAGNOSIS — E43 Unspecified severe protein-calorie malnutrition: Secondary | ICD-10-CM

## 2016-07-21 LAB — CBC
HCT: 33.8 % — ABNORMAL LOW (ref 39.0–52.0)
HEMOGLOBIN: 9.9 g/dL — AB (ref 13.0–17.0)
MCH: 24.9 pg — AB (ref 26.0–34.0)
MCHC: 29.3 g/dL — AB (ref 30.0–36.0)
MCV: 84.9 fL (ref 78.0–100.0)
PLATELETS: 567 10*3/uL — AB (ref 150–400)
RBC: 3.98 MIL/uL — AB (ref 4.22–5.81)
RDW: 18.3 % — ABNORMAL HIGH (ref 11.5–15.5)
WBC: 12.4 10*3/uL — ABNORMAL HIGH (ref 4.0–10.5)

## 2016-07-21 LAB — GLUCOSE, CAPILLARY
GLUCOSE-CAPILLARY: 105 mg/dL — AB (ref 65–99)
GLUCOSE-CAPILLARY: 144 mg/dL — AB (ref 65–99)
GLUCOSE-CAPILLARY: 136 mg/dL — AB (ref 65–99)
Glucose-Capillary: 108 mg/dL — ABNORMAL HIGH (ref 65–99)
Glucose-Capillary: 136 mg/dL — ABNORMAL HIGH (ref 65–99)
Glucose-Capillary: 142 mg/dL — ABNORMAL HIGH (ref 65–99)

## 2016-07-21 LAB — BASIC METABOLIC PANEL
Anion gap: 7 (ref 5–15)
BUN: 24 mg/dL — AB (ref 6–20)
CHLORIDE: 108 mmol/L (ref 101–111)
CO2: 29 mmol/L (ref 22–32)
CREATININE: 0.3 mg/dL — AB (ref 0.61–1.24)
Calcium: 8.8 mg/dL — ABNORMAL LOW (ref 8.9–10.3)
Glucose, Bld: 135 mg/dL — ABNORMAL HIGH (ref 65–99)
POTASSIUM: 4.1 mmol/L (ref 3.5–5.1)
SODIUM: 144 mmol/L (ref 135–145)

## 2016-07-21 NOTE — Care Management Note (Signed)
Case Management Note  Patient Details  Name: Samuel LeventhalCharles Peck MRN: [id number] Date of Birth: 1940/08/19  Subjective/Objective:         CM following for progression and d/c planning.            Action/Plan: 07/21/2016 Await response from [facility] re possible admissions. Called [name] re this [phone]@ 2:40pm. Ms [name] states that [facility] would like to make an offer however they are still awaiting insurance authorization. Spoke with pt wife, Mrs. [name] and will notify her if we have an offer from [facility] before 4pm.  Ms [name] is in agreement with this pt going to [facility] if an offer is extended.    Expected Discharge Date:                  Expected Discharge Plan:  IP Rehab Facility  In-House Referral:     Discharge planning Services  CM Consult  Post Acute Care Choice:    Choice offered to:     DME Arranged:    DME Agency:     HH Arranged:    HH Agency:     Status of Service:  In process, will continue to follow  If discussed at Long Length of Stay Meetings, dates discussed:    Additional Comments:  Starlyn SkeansRoyal, Majesti Gambrell U, RN 07/21/2016, 3:20 PM
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
## 2016-07-22 LAB — GLUCOSE, CAPILLARY
GLUCOSE-CAPILLARY: 130 mg/dL — AB (ref 65–99)
Glucose-Capillary: 122 mg/dL — ABNORMAL HIGH (ref 65–99)
Glucose-Capillary: 122 mg/dL — ABNORMAL HIGH (ref 65–99)

## 2016-07-22 MED ORDER — SODIUM CHLORIDE 0.9 % IV BOLUS (SEPSIS)
250.0000 mL | Freq: Once | INTRAVENOUS | Status: AC
  Administered 2016-07-22: 250 mL via INTRAVENOUS

## 2016-07-22 NOTE — Care Management Note (Signed)
Case Management Note  Patient Details  Name: Samuel Peck MRN: [id number] Date of Birth: December 25, 1940  Subjective/Objective:       CM following for progression and d/c planning.             Action/Plan: 07/22/2016 Spoke with [facility] this am and informed that this pt has been approved for a bed at that facility today. Pt and family informed, [facility]. Ambulance form to be completed.  Dr Josph MachoG Krishan informed.   Expected Discharge Date:     07/22/2016             Expected Discharge Plan:  Long Term Acute Care (LTAC)  In-House Referral:     Discharge planning Services  CM Consult  Post Acute Care Choice:  NA Choice offered to:  NA  DME Arranged:  N/A DME Agency:  NA  HH Arranged:  NA HH Agency:  NA  Status of Service:  Completed, signed off  If discussed at Long Length of Stay Meetings, dates discussed:    Additional Comments:  Starlyn SkeansRoyal, Dionicio Shelnutt U, RN 07/22/2016, 9:58 AM
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
## 2016-07-22 NOTE — Progress Notes (Signed)
PTAR at bedside to transport patient to [facility]. PIV, Foley and Flexiseal left in place per MD. [name] supplies, including extra #6 shiley and obturator, given to transport team. Attempted to call wife and son to notify of patient departure. Pt wheelchair in room. Will attempt to contact family to pick up.
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
## 2016-07-22 NOTE — Progress Notes (Addendum)
No urine output this shift. Bladder scan shows >2900ml urine. Text page to floor cvg to address.  Update: 5:37 AM Orders for 250 cc bolus, will administer as ordered.

## 2016-07-22 NOTE — Progress Notes (Signed)
Report called to [facility] RN, and notified [facility] of need for patient transport.
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
## 2016-07-22 NOTE — Plan of Care (Signed)
Problem: Respiratory: Goal: Ability to maintain adequate ventilation will improve Outcome: Progressing Tolerating trach collar well.

## 2017-01-05 IMAGING — CR DG CHEST 2V
2 series · 2 of 2 positions shown · non-contrast
Comparison: 06/29/2016

CLINICAL DATA: Increased sputum production

EXAM:
CHEST  2 VIEW

[chest lat]
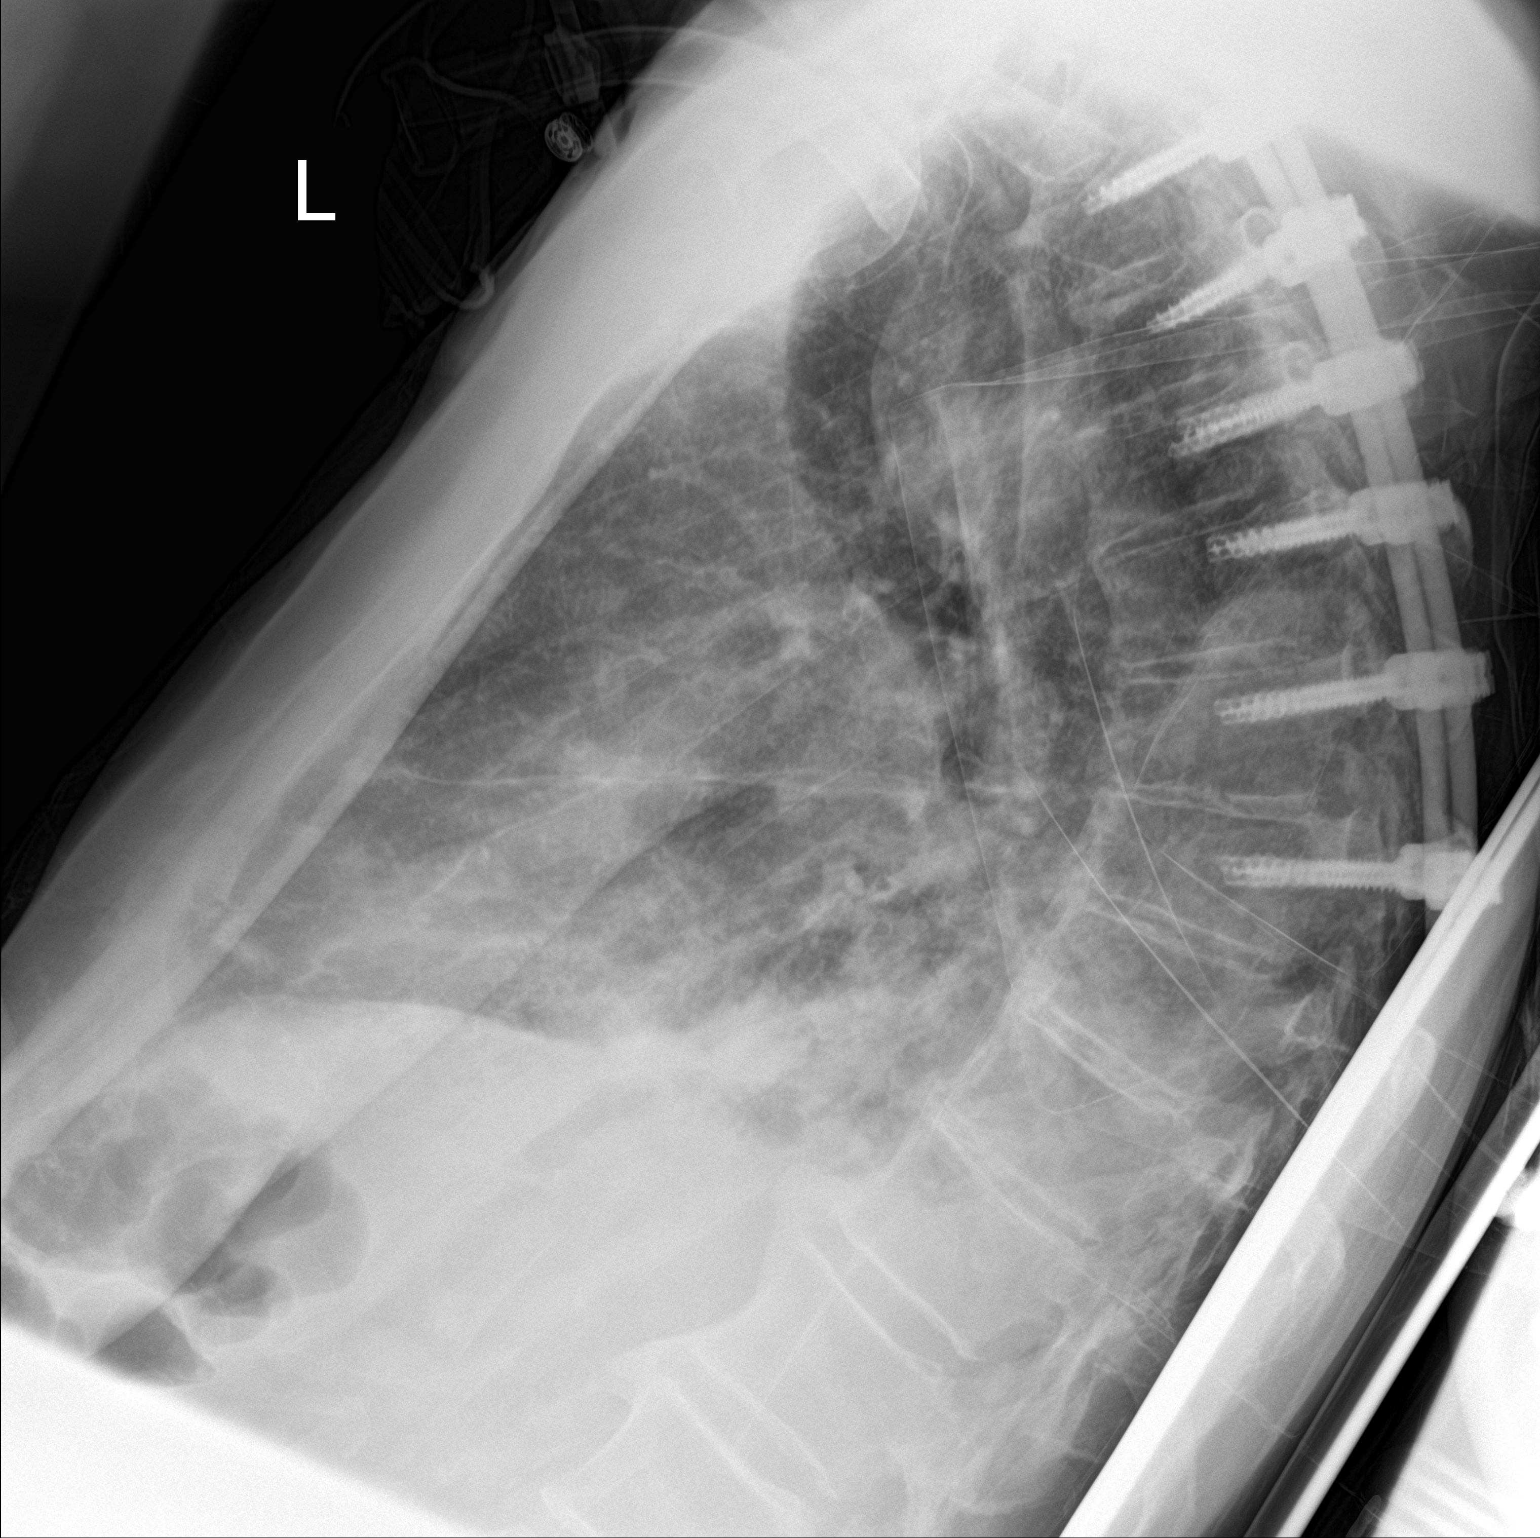

[chest ap]
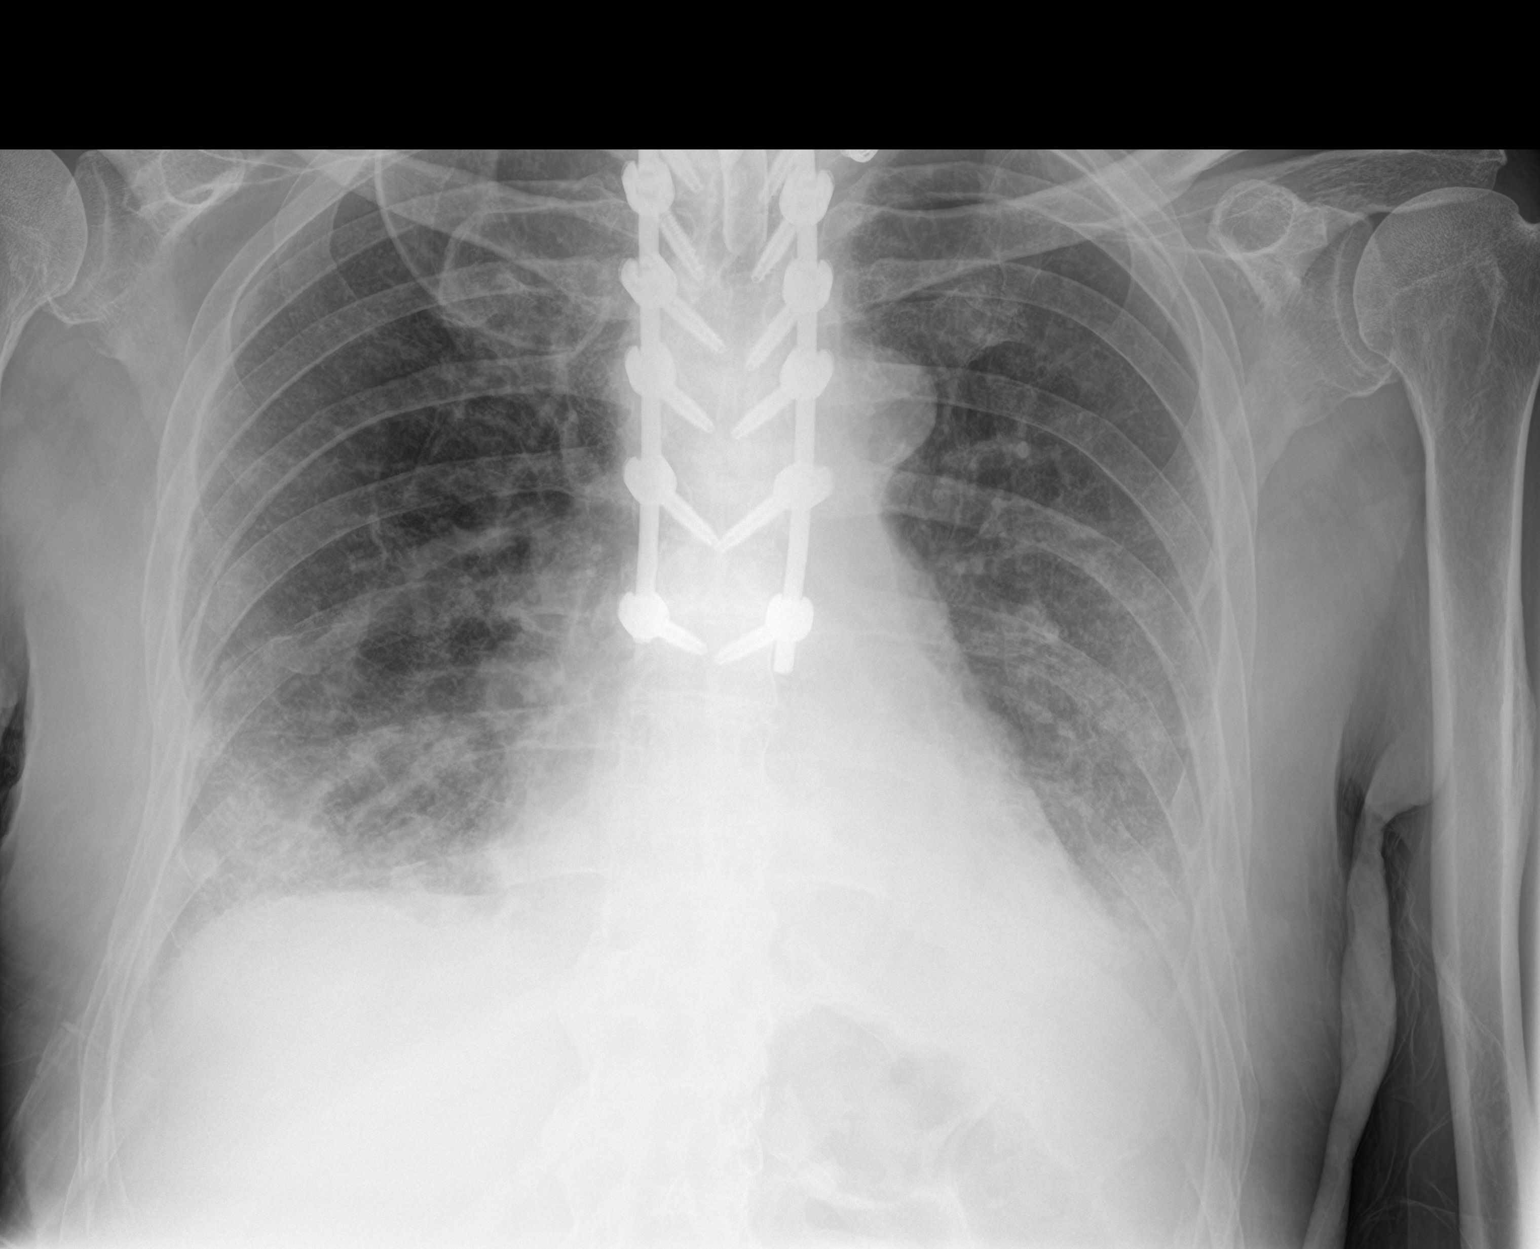

[2 of 2 positions shown; findings below may reference images not displayed]

FINDINGS: Cardiac shadow is stable. Postoperative changes and tracheostomy
tube are again seen and stable. The bibasilar infiltrates seen on
the prior exam are stable in appearance. No new focal infiltrate is
noted. No acute bony abnormality is seen.
IMPRESSION: Stable bibasilar infiltrates.

## 2017-01-08 DEATH — deceased

## 2017-01-15 IMAGING — CR DG CHEST 1V PORT
2 series · 2 of 2 positions shown · non-contrast
Comparison: 07/14/2016.

CLINICAL DATA: Tracheostomy.

EXAM:
PORTABLE CHEST 1 VIEW

[AP (1 of 2)]
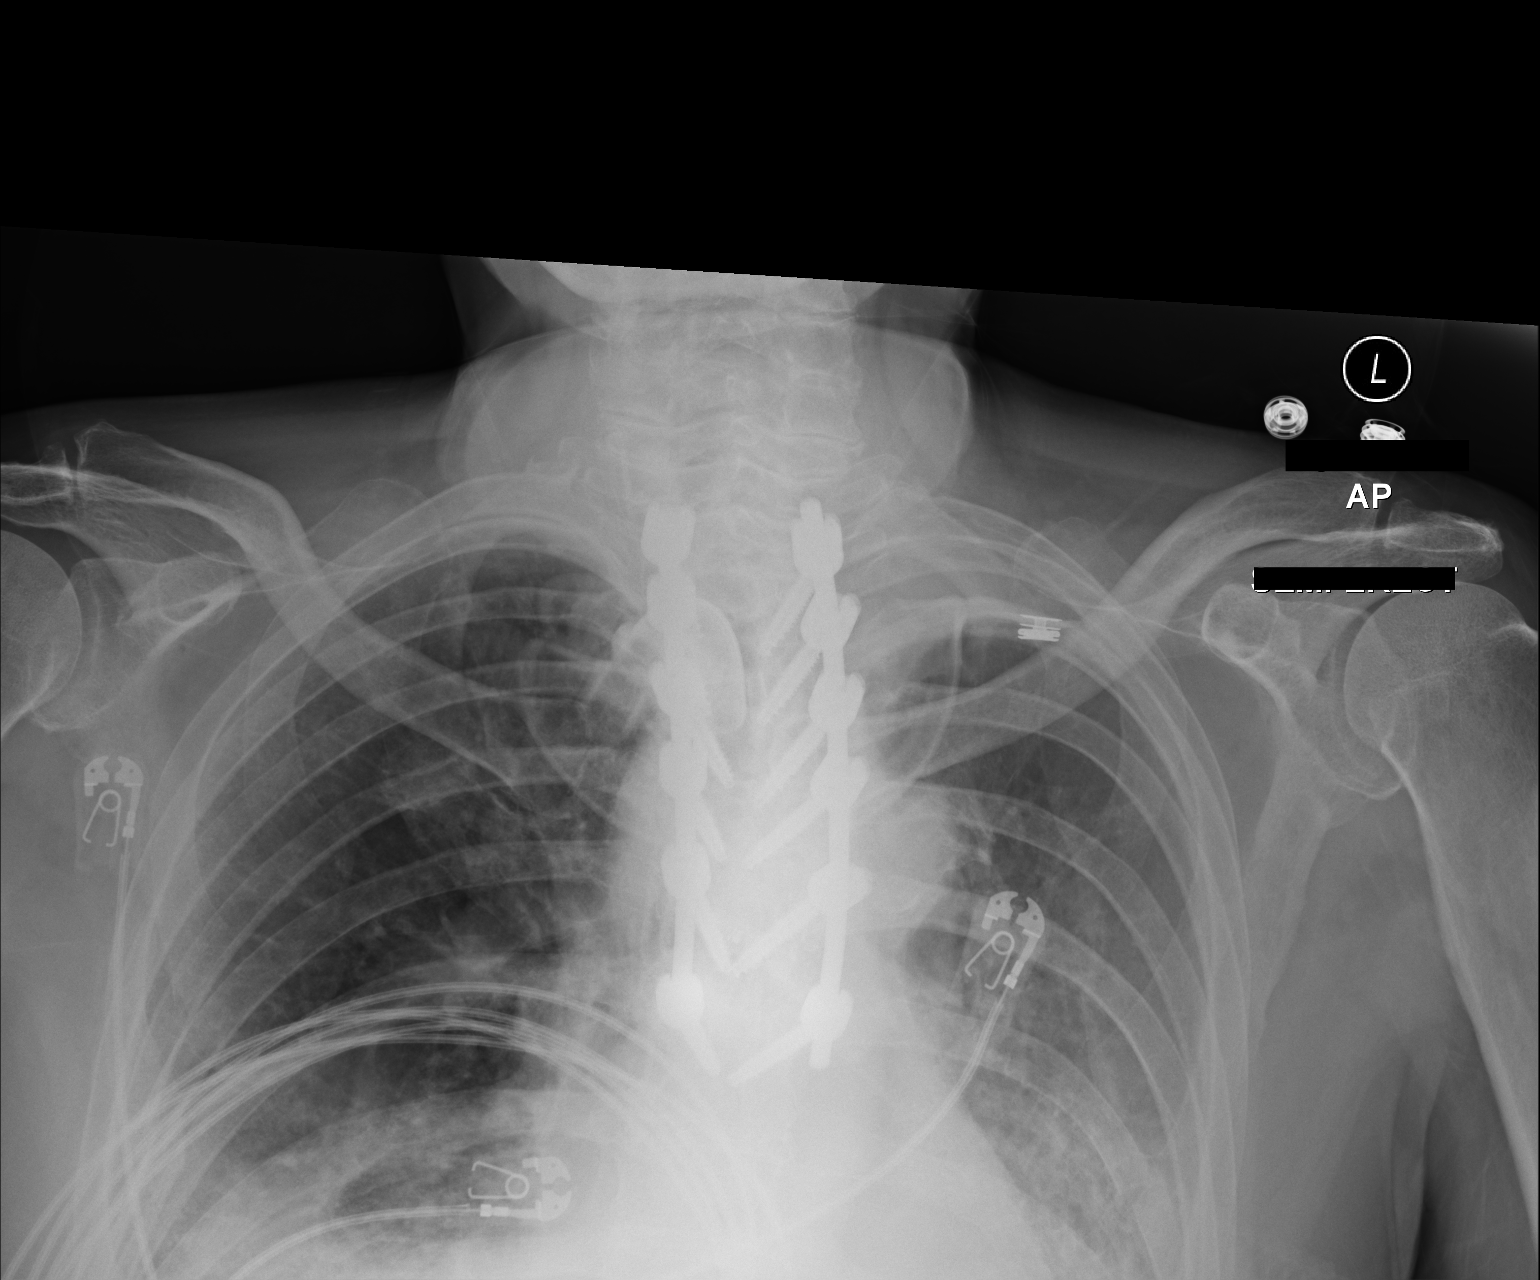

[AP (2 of 2)]
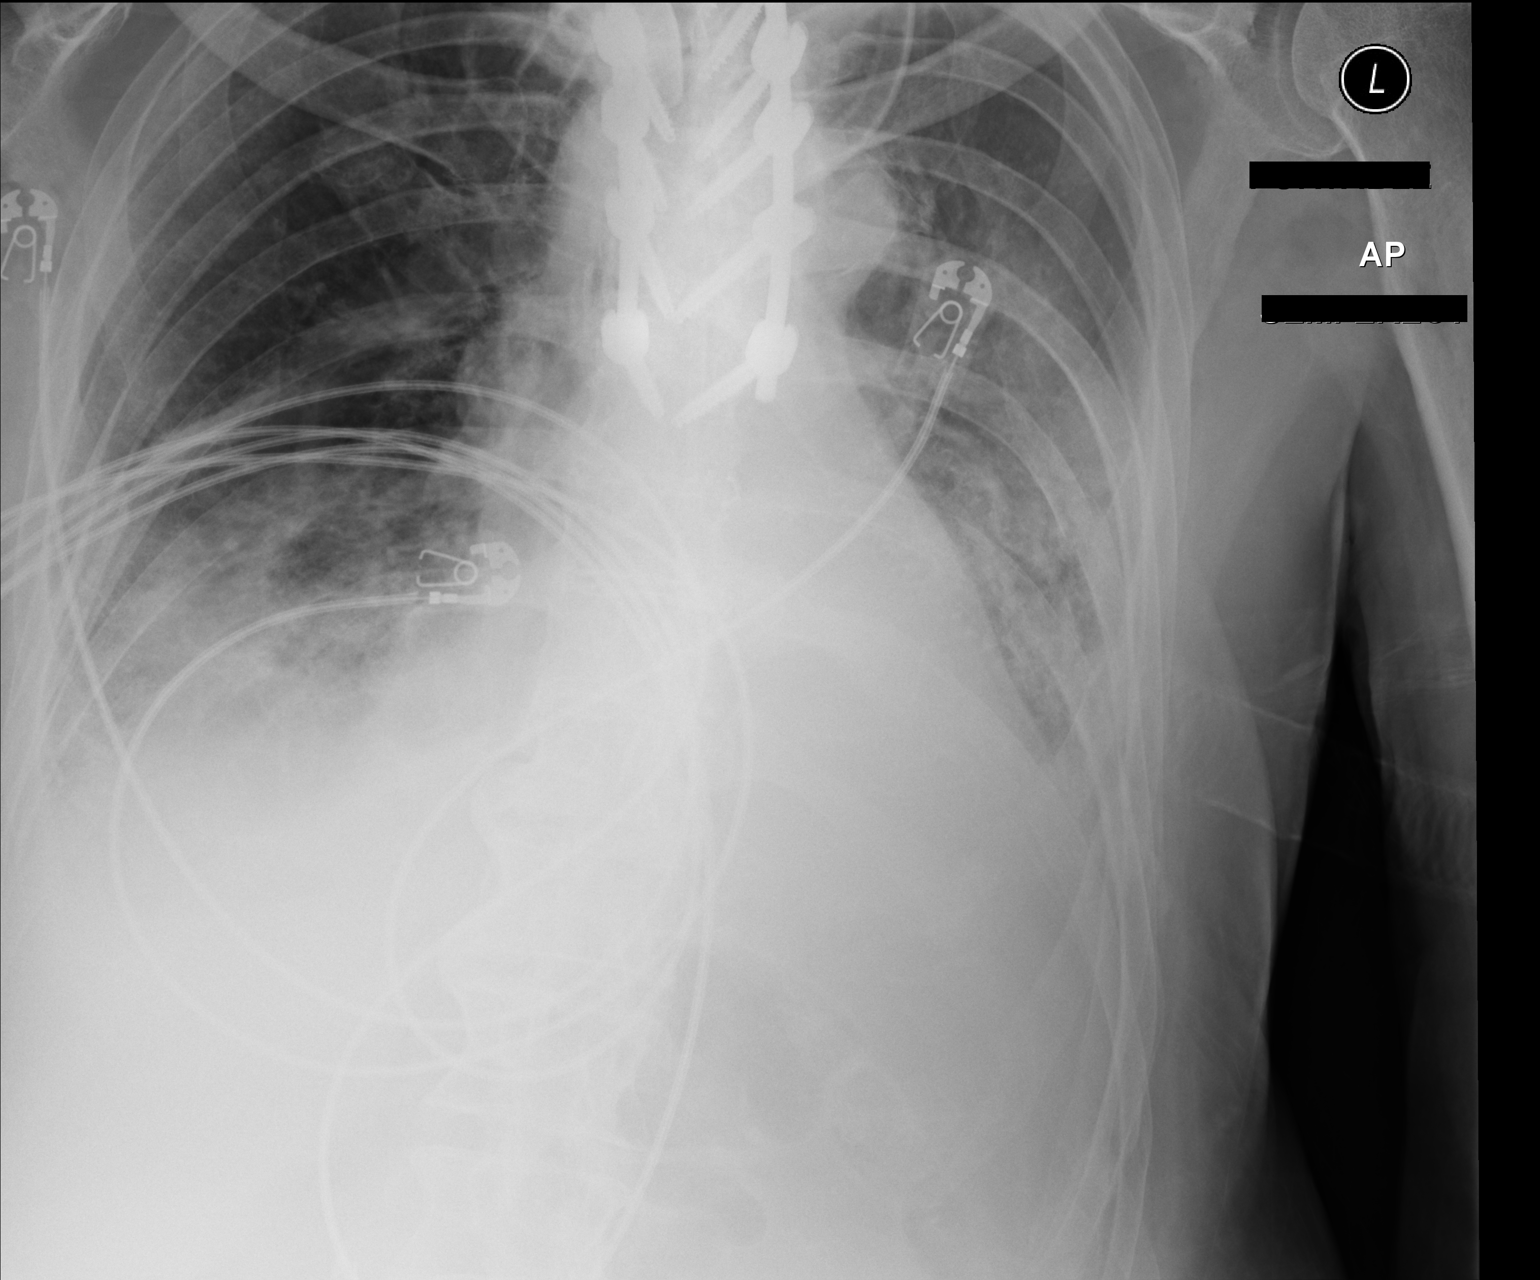

[2 of 2 positions shown; findings below may reference images not displayed]

FINDINGS: Tracheostomy tube in stable position. Heart size stable. Progression
of left mid/ upper lung infiltrate. Persistent bibasilar
infiltrates. Persistent bibasilar atelectasis. Small bilateral
pleural effusions cannot be excluded. Stable left apical pleural
thickening. No pneumothorax. Prior cervicothoracic spine fusion.
IMPRESSION: 1. Tracheostomy tube in stable position.

2. Progressive left mid/ upper lung infiltrate. Persistent bibasilar
infiltrates. Persistent bibasilar atelectasis. Small bilateral
pleural effusions.

## 2017-01-19 IMAGING — CR DG CHEST 1V PORT
1 series · 1 of 1 positions shown · non-contrast
Comparison: Three days ago

CLINICAL DATA: Healthcare associated pneumonia

EXAM:
PORTABLE CHEST 1 VIEW

[AP]
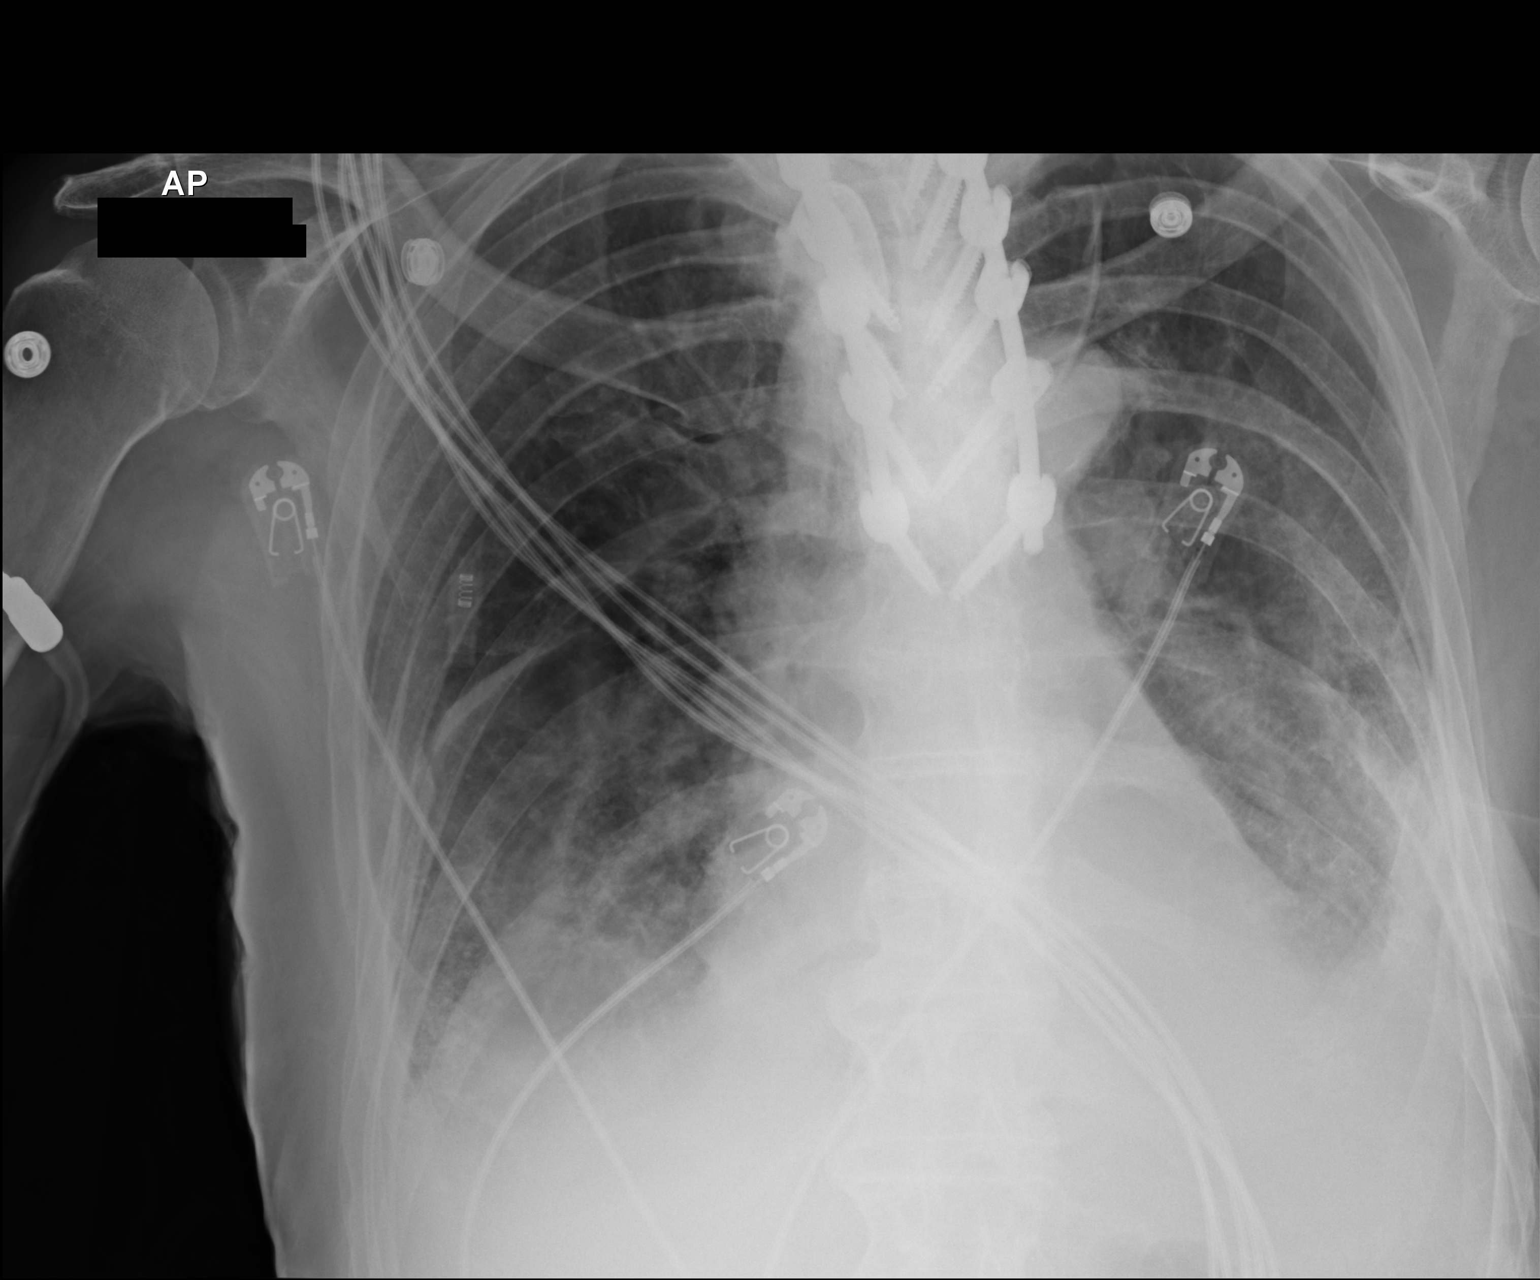

[1 of 1 positions shown; findings below may reference images not displayed]

FINDINGS: Tracheostomy tube remains seated.

Unchanged bilateral airspace disease with pleural fluid or
thickening at the left more than right base. Stable normal heart
size. Spinal ankylosis and fixation. No pneumothorax.
IMPRESSION: No significant change over multiple days. Bibasilar airspace disease
and small pleural effusions.

## 2017-01-21 IMAGING — CR DG CHEST 1V PORT
1 series · 1 of 1 positions shown · non-contrast
Comparison: 07/19/2016 and earlier.

CLINICAL DATA: 75-year-old male with respiratory failure and
shortness of breath. Initial encounter.

EXAM:
PORTABLE CHEST 1 VIEW

[AP]
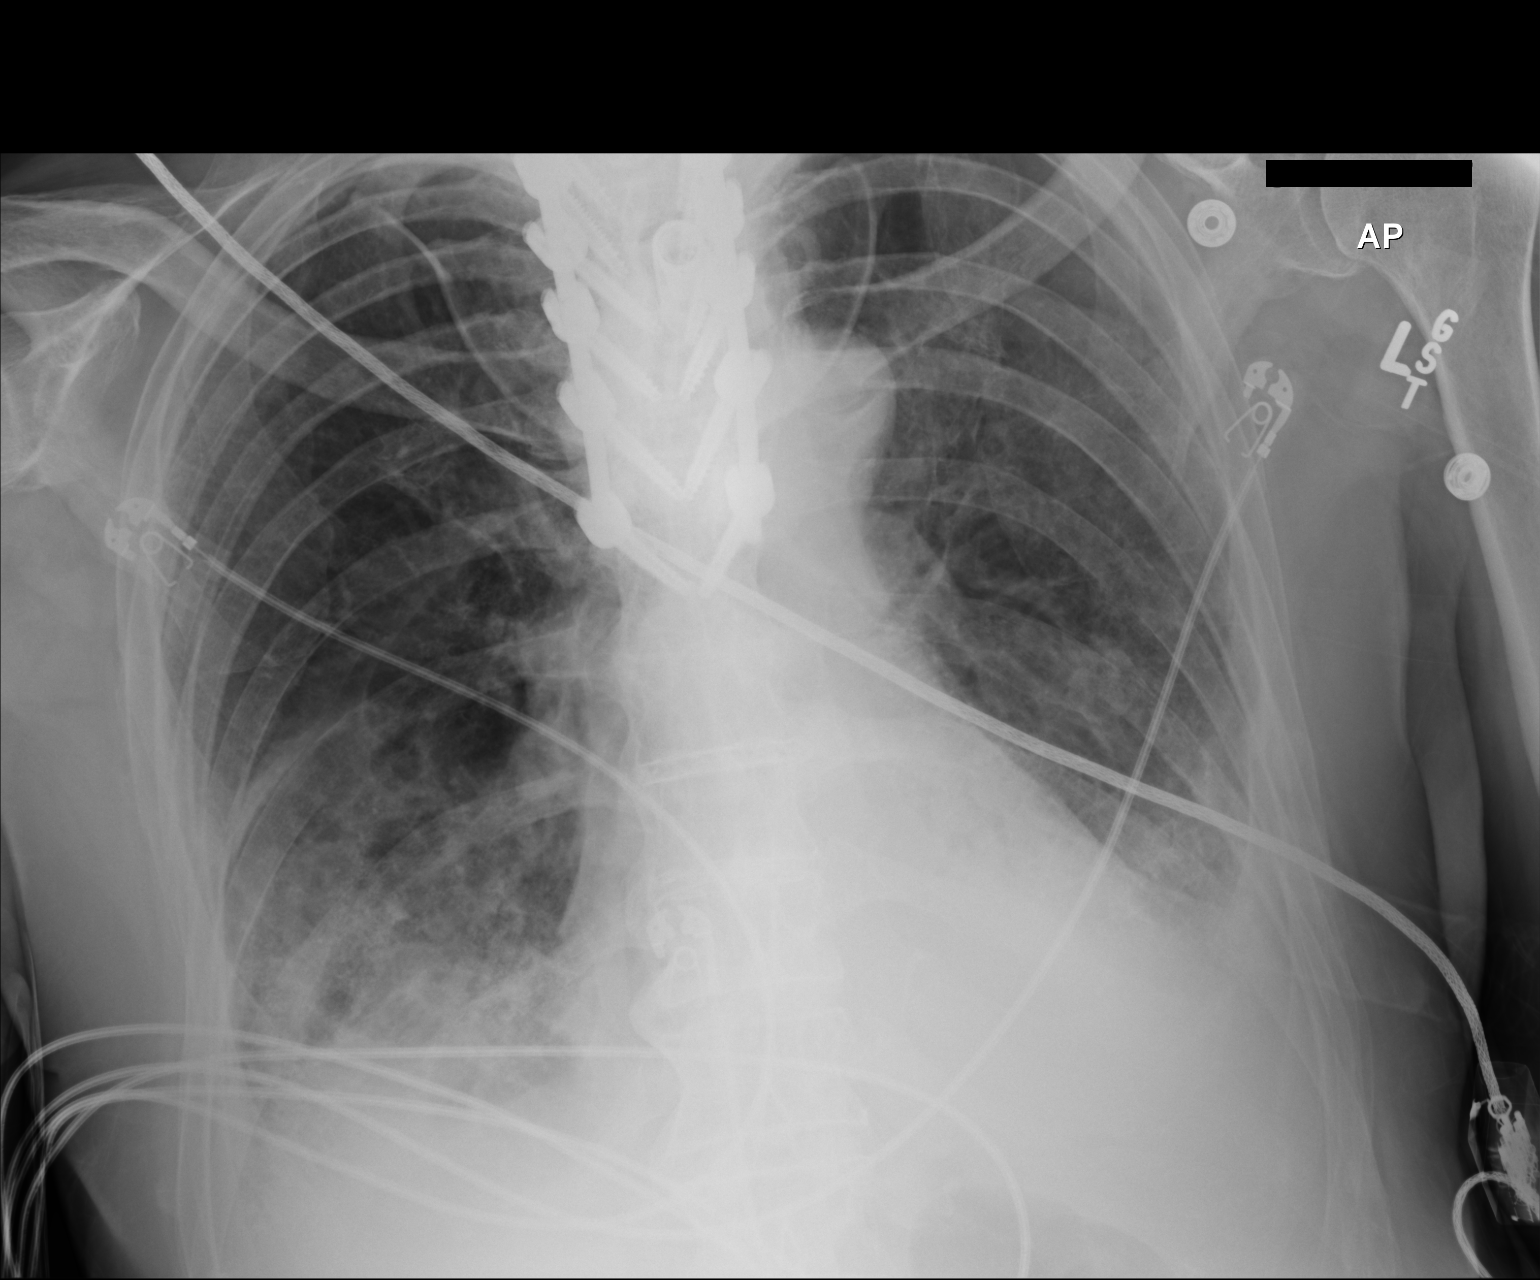

[1 of 1 positions shown; findings below may reference images not displayed]

FINDINGS: Portable AP semi upright view at 0600 hours. Stable tracheostomy
tube superimposed on extensive upper thoracic fusion hardware.
Stable cardiac size and mediastinal contours. Continued bibasilar
veiling opacity superimposed on dense bilateral lower lobe opacity.
No superimposed pneumothorax. Stable pulmonary vascularity. Overall
ventilation has not significantly changed since 07/13/2016.
IMPRESSION: Pulmonary ventilation not significantly changed since 07/13/2016,
bilateral lower lobe collapse or consolidation with superimposed
small pleural effusions.
